# Patient Record
Sex: Female | Born: 1960 | Race: White | Hispanic: No | Marital: Married | Smoking: Never smoker
Health system: Southern US, Community
[De-identification: ages and names within clinical notes are randomized; demographics above are authoritative.]

## PROBLEM LIST (undated history)

## (undated) DIAGNOSIS — I10 Essential (primary) hypertension: Secondary | ICD-10-CM

## (undated) DIAGNOSIS — K589 Irritable bowel syndrome without diarrhea: Secondary | ICD-10-CM

## (undated) DIAGNOSIS — S42309A Unspecified fracture of shaft of humerus, unspecified arm, initial encounter for closed fracture: Secondary | ICD-10-CM

## (undated) DIAGNOSIS — M1990 Unspecified osteoarthritis, unspecified site: Secondary | ICD-10-CM

## (undated) DIAGNOSIS — M8580 Other specified disorders of bone density and structure, unspecified site: Secondary | ICD-10-CM

## (undated) HISTORY — PX: PELVIC LAPAROSCOPY: SHX162

## (undated) HISTORY — PX: HYSTEROSCOPY: SHX211

## (undated) HISTORY — DX: Unspecified osteoarthritis, unspecified site: M19.90

## (undated) HISTORY — DX: Unspecified fracture of shaft of humerus, unspecified arm, initial encounter for closed fracture: S42.309A

## (undated) HISTORY — DX: Essential (primary) hypertension: I10

## (undated) HISTORY — DX: Irritable bowel syndrome, unspecified: K58.9

## (undated) HISTORY — PX: DILATION AND CURETTAGE OF UTERUS: SHX78

## (undated) HISTORY — DX: Other specified disorders of bone density and structure, unspecified site: M85.80

## (undated) HISTORY — PX: TOTAL KNEE ARTHROPLASTY: SHX125

---

## 1997-08-09 ENCOUNTER — Other Ambulatory Visit: Admission: RE | Admit: 1997-08-09 | Discharge: 1997-08-09 | Payer: Self-pay | Admitting: Obstetrics and Gynecology

## 1998-09-22 ENCOUNTER — Other Ambulatory Visit: Admission: RE | Admit: 1998-09-22 | Discharge: 1998-09-22 | Payer: Self-pay | Admitting: Obstetrics and Gynecology

## 1999-09-22 ENCOUNTER — Other Ambulatory Visit: Admission: RE | Admit: 1999-09-22 | Discharge: 1999-09-22 | Payer: Self-pay | Admitting: Obstetrics and Gynecology

## 2000-08-20 ENCOUNTER — Ambulatory Visit (HOSPITAL_COMMUNITY): Admission: RE | Admit: 2000-08-20 | Discharge: 2000-08-20 | Payer: Self-pay | Admitting: Obstetrics and Gynecology

## 2000-08-20 ENCOUNTER — Encounter: Payer: Self-pay | Admitting: Obstetrics and Gynecology

## 2000-08-29 ENCOUNTER — Encounter: Admission: RE | Admit: 2000-08-29 | Discharge: 2000-08-29 | Payer: Self-pay | Admitting: Obstetrics and Gynecology

## 2000-08-29 ENCOUNTER — Encounter: Payer: Self-pay | Admitting: Obstetrics and Gynecology

## 2000-09-24 ENCOUNTER — Other Ambulatory Visit: Admission: RE | Admit: 2000-09-24 | Discharge: 2000-09-24 | Payer: Self-pay | Admitting: Obstetrics and Gynecology

## 2001-09-24 ENCOUNTER — Other Ambulatory Visit: Admission: RE | Admit: 2001-09-24 | Discharge: 2001-09-24 | Payer: Self-pay | Admitting: Obstetrics and Gynecology

## 2001-10-01 ENCOUNTER — Encounter: Payer: Self-pay | Admitting: Obstetrics and Gynecology

## 2001-10-01 ENCOUNTER — Encounter: Admission: RE | Admit: 2001-10-01 | Discharge: 2001-10-01 | Payer: Self-pay | Admitting: Obstetrics and Gynecology

## 2002-09-25 ENCOUNTER — Other Ambulatory Visit: Admission: RE | Admit: 2002-09-25 | Discharge: 2002-09-25 | Payer: Self-pay | Admitting: Obstetrics and Gynecology

## 2002-10-28 ENCOUNTER — Other Ambulatory Visit: Admission: RE | Admit: 2002-10-28 | Discharge: 2002-10-28 | Payer: Self-pay | Admitting: Obstetrics and Gynecology

## 2003-04-09 ENCOUNTER — Other Ambulatory Visit: Admission: RE | Admit: 2003-04-09 | Discharge: 2003-04-09 | Payer: Self-pay | Admitting: Obstetrics and Gynecology

## 2003-04-13 ENCOUNTER — Encounter: Admission: RE | Admit: 2003-04-13 | Discharge: 2003-04-13 | Payer: Self-pay | Admitting: Obstetrics and Gynecology

## 2003-05-13 ENCOUNTER — Encounter (INDEPENDENT_AMBULATORY_CARE_PROVIDER_SITE_OTHER): Payer: Self-pay | Admitting: *Deleted

## 2003-05-13 ENCOUNTER — Ambulatory Visit (HOSPITAL_COMMUNITY): Admission: RE | Admit: 2003-05-13 | Discharge: 2003-05-13 | Payer: Self-pay | Admitting: Obstetrics and Gynecology

## 2003-05-13 ENCOUNTER — Ambulatory Visit (HOSPITAL_BASED_OUTPATIENT_CLINIC_OR_DEPARTMENT_OTHER): Admission: RE | Admit: 2003-05-13 | Discharge: 2003-05-13 | Payer: Self-pay | Admitting: Obstetrics and Gynecology

## 2003-10-05 ENCOUNTER — Other Ambulatory Visit: Admission: RE | Admit: 2003-10-05 | Discharge: 2003-10-05 | Payer: Self-pay | Admitting: Obstetrics and Gynecology

## 2004-01-09 HISTORY — PX: VAGINAL HYSTERECTOMY: SUR661

## 2004-01-17 ENCOUNTER — Encounter (INDEPENDENT_AMBULATORY_CARE_PROVIDER_SITE_OTHER): Payer: Self-pay | Admitting: *Deleted

## 2004-01-17 ENCOUNTER — Observation Stay (HOSPITAL_COMMUNITY): Admission: RE | Admit: 2004-01-17 | Discharge: 2004-01-18 | Payer: Self-pay | Admitting: Obstetrics and Gynecology

## 2004-05-10 ENCOUNTER — Encounter: Admission: RE | Admit: 2004-05-10 | Discharge: 2004-05-10 | Payer: Self-pay | Admitting: Obstetrics and Gynecology

## 2004-10-05 ENCOUNTER — Other Ambulatory Visit: Admission: RE | Admit: 2004-10-05 | Discharge: 2004-10-05 | Payer: Self-pay | Admitting: Obstetrics and Gynecology

## 2005-05-14 ENCOUNTER — Encounter: Admission: RE | Admit: 2005-05-14 | Discharge: 2005-05-14 | Payer: Self-pay | Admitting: Obstetrics and Gynecology

## 2005-10-09 ENCOUNTER — Other Ambulatory Visit: Admission: RE | Admit: 2005-10-09 | Discharge: 2005-10-09 | Payer: Self-pay | Admitting: Obstetrics and Gynecology

## 2006-05-21 ENCOUNTER — Encounter: Admission: RE | Admit: 2006-05-21 | Discharge: 2006-05-21 | Payer: Self-pay | Admitting: Obstetrics and Gynecology

## 2006-10-15 ENCOUNTER — Other Ambulatory Visit: Admission: RE | Admit: 2006-10-15 | Discharge: 2006-10-15 | Payer: Self-pay | Admitting: Obstetrics and Gynecology

## 2007-05-27 ENCOUNTER — Encounter: Admission: RE | Admit: 2007-05-27 | Discharge: 2007-05-27 | Payer: Self-pay | Admitting: Obstetrics and Gynecology

## 2007-10-20 ENCOUNTER — Other Ambulatory Visit: Admission: RE | Admit: 2007-10-20 | Discharge: 2007-10-20 | Payer: Self-pay | Admitting: Obstetrics and Gynecology

## 2007-10-20 ENCOUNTER — Encounter: Payer: Self-pay | Admitting: Obstetrics and Gynecology

## 2007-10-20 ENCOUNTER — Ambulatory Visit: Payer: Self-pay | Admitting: Obstetrics and Gynecology

## 2008-06-22 ENCOUNTER — Encounter: Admission: RE | Admit: 2008-06-22 | Discharge: 2008-06-22 | Payer: Self-pay | Admitting: Obstetrics and Gynecology

## 2008-10-25 ENCOUNTER — Other Ambulatory Visit: Admission: RE | Admit: 2008-10-25 | Discharge: 2008-10-25 | Payer: Self-pay | Admitting: Obstetrics and Gynecology

## 2008-10-25 ENCOUNTER — Encounter: Payer: Self-pay | Admitting: Obstetrics and Gynecology

## 2008-10-25 ENCOUNTER — Ambulatory Visit: Payer: Self-pay | Admitting: Obstetrics and Gynecology

## 2009-06-27 ENCOUNTER — Encounter: Admission: RE | Admit: 2009-06-27 | Discharge: 2009-06-27 | Payer: Self-pay | Admitting: Obstetrics and Gynecology

## 2009-10-31 ENCOUNTER — Ambulatory Visit: Payer: Self-pay | Admitting: Obstetrics and Gynecology

## 2009-10-31 ENCOUNTER — Other Ambulatory Visit: Admission: RE | Admit: 2009-10-31 | Discharge: 2009-10-31 | Payer: Self-pay | Admitting: Obstetrics and Gynecology

## 2010-05-26 NOTE — H&P (Signed)
NAMESheronica, Corey Pittman              ACCOUNT NO.:  [id number]   MEDICAL RECORD NO.:  [id number]          PATIENT TYPE:  AMB   LOCATION:  DAY                          FACILITY:  [facility]   PHYSICIAN:  Daniel L. Gottsegen, M.D.DATE OF BIRTH:  Nov 24, 1960   DATE OF ADMISSION:  01/17/2004  DATE OF DISCHARGE:                                HISTORY & PHYSICAL   CHIEF COMPLAINT:  Menorrhagia, dysmenorrhea, with anemia and fibroids.   HISTORY OF PRESENT ILLNESS:  The patient is a 50 year old nulligravida who  has been followed in the office over the past several years with increasing  menorrhagia and dysmenorrhea.  She has become anemic as a result of the  above and has required iron therapy.  She finds that when she has her  periods they are excessively heavy.  If she is in the car and taking a 2-  hour trip she ends up having to stop five or six times because her periods  are so heavy.  She had previously had a D&C hysteroscopy for a small  endometrial polyp but her problem has persisted in spite of removing that.  It is felt that at least part of the problem is due to her multiple small  fibroids in her uterus.  She has also tried birth control pills with only  moderate success and she is now having side effects from her birth control  pills.  In addition, she has tried a variety of nonsteroidal  antiinflammatory drugs.  As a result of this, she enters the hospital now  for a vaginal hysterectomy for treatment of menometrorrhagia nonresponsive  to the above.  She knows that there is a chance that it may have to be done  abdominally since she has never had children.   PAST MEDICAL HISTORY:  Reveals a previous D&C hysteroscopy for DUB.  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   PRESENT MEDICATIONS:  Iron.  She is allergic to  CODEINE.  Other medications  besides iron are Allegra D, Detrol LA, Flonase, and Imitrex for headaches.   FAMILY HISTORY:  Mother had lung cancer.  Mother also was hypertensive as  was her maternal grandmother and her father.   SOCIAL HISTORY:  She is a nonsmoker, nondrinker.   REVIEW OF SYSTEMS:  HEENT:  Reveals frequent migraines.  CARDIAC:  Negative.  PULMONARY:  Reveals a history of asthma.  GI:  Negative.  GU:  Reveals a  history of overactive bladder.  NEUROMUSCULAR:  Negative.  ENDOCRINE:  Negative.   PHYSICAL EXAMINATION:  GENERAL:  The patient is a well-developed and well-  nourished female in no acute distress.  VITAL SIGNS:  Blood pressure is 130/80, pulse is 80 and regular,  respirations 16 and nonlabored, she is afebrile.  HEENT:  All within normal limits.  NECK:  Supple, trachea in the midline.  Thyroid is not enlarged.  LUNGS:  Clear to P&A.  HEART:  No thrills, heaves, or murmurs.  BREASTS:  No masses.  ABDOMEN:  Soft without guarding, rebound, or masses.  PELVIC:  External and vaginal is normal.  Cervix is clean.  Pap smear shows  no atypia.  Uterus is enlarged by small fibroids to 6-7 weeks size.  Adnexa  are palpably normal.  Rectal is negative.   ADMISSION IMPRESSION:  Increasing dysmenorrhea, menorrhagia, fibroids, and  anemia.   PLAN:  Vaginal hysterectomy.     Dani   DLG/MEDQ  D:  01/17/2004  T:  01/17/2004  Job:  [id number]

## 2010-05-26 NOTE — Op Note (Signed)
Pamela Pittman, Pamela Pittman                        ACCOUNT NO.:  [id number]   MEDICAL RECORD NO.:  [id number]                   PATIENT TYPE:  AMB   LOCATION:  [facility]                                 FACILITY:  [facility]   PHYSICIAN:  Daniel L. Eda Paschal, M.D.           DATE OF BIRTH:  1960-02-07   DATE OF PROCEDURE:  05/13/2003  DATE OF DISCHARGE:                                 OPERATIVE REPORT   PREOPERATIVE DIAGNOSIS:  Dysfunctional uterine bleeding with multiple  endometrial polyps.   POSTOPERATIVE DIAGNOSIS:  Dysfunctional uterine bleeding with multiple  endometrial polyps.   OPERATION/PROCEDURE:  Hysteroscopy, dilatation and curettage.   SURGEON:  Daniel L. Eda Paschal, M.D.   ANESTHESIA:  General.   INDICATIONS:  The patient is a 50 year old nulligravida, who presented to  the office with menometrorrhagia nonresponsive to oral progestins.  An SIH  was done in the office which showed multiple filling defects, intrauterine  cavity.  She now enters the hospital for excision of endometrial polyps.   FINDINGS:  External and vaginal normal.  Cervix is clean. Uterus is severely  retroverted, normal size and shape with less than first-degree uterine  descensus.  Adnexa palpably normal.  At the time of hysteroscopy, the  patient had multiple small endometrial polyps involving three of the four  walls of the fundus.  Other than this, there were no other abnormal  findings.   DESCRIPTION OF PROCEDURE:  After adequate general anesthesia, the patient  was placed in the dorsal lithotomy position and prepped and draped in the  usual sterile manner.  Single-tooth tenaculum was placed in the anterior lip  of the cervix.  Dilatation was difficult both because of her retroverted  uterus and nulligravida state.  She could be dilated to about a 29 but at  this point it became exceedingly difficult.  It was elected, therefore, to  first start with a diagnostic hysteroscope.  It was placed and 3%  sorbitol  was used to expand the intrauterine cavity.  Camera was used for  magnification.  A good view was obtained and the patient had multiple  defects that appeared to be endometrial polyps.  At this point hysteroscope  was removed.  She was curetted with three different size curets.  Multiple  polyps were removed as well as endometrial curettings and all were sent as  one specimen to pathology.  She was then rehysteroscoped and it appeared  that there were no more defects and that the cavity was now clean.  One more  attempt was made to dilate her further with hopes that if we put the  resectoscope in, we might see something else because it was somewhat of a  dark view with the diagnostic scope, but it was really not possible to  dilate her anymore without injuring the cervix.  Once again, we tried to  follow the scope in under the pressure with the resectoscope, and this was  not possible  so the procedure was terminated.  Blood loss for the entire  procedure was less than 30 mL with none replaced.  Fluid deficit was 125 mL.  The patient tolerated the procedure well and left the operating room in  satisfactory condition.                                               Daniel L. Eda Paschal, M.D.   Tonette Bihari  D:  05/13/2003  T:  05/13/2003  Job:  [id number]

## 2010-05-26 NOTE — Op Note (Signed)
NAMEAnthonette, Pamela Pittman              ACCOUNT NO.:  [id number]   MEDICAL RECORD NO.:  [id number]          PATIENT TYPE:  AMB   LOCATION:  DAY                          FACILITY:  [facility]   PHYSICIAN:  Daniel L. Gottsegen, M.D.DATE OF BIRTH:  06/22/1960   DATE OF PROCEDURE:  01/17/2004  DATE OF DISCHARGE:                                 OPERATIVE REPORT   PREOPERATIVE DIAGNOSES:  1.  Dysfunctional uterine bleeding with fibroids.  2.  Anemia.  3.  Dysmenorrhea.   POSTOPERATIVE DIAGNOSES:  1.  Dysfunctional uterine bleeding with fibroids.  2.  Anemia.  3.  Dysmenorrhea.   OPERATION:  Vaginal hysterectomy.   SURGEON:  Daniel L. Eda Paschal, M.D.   FIRST ASSISTANT:  Timothy P. Fontaine, M.D.   ANESTHESIA:  General endotracheal.   FINDINGS:  The patient's uterus was enlarged by multiple small fibroids to  about 6-7 weeks' size.  Ovaries, fallopian tubes, and pelvic peritoneum were  free of any disease.   PROCEDURE:  After adequate general endotracheal anesthesia, the patient was  placed in the dorsal lithotomy position and prepped and draped in the usual  sterile manner.  A 1:200,000 solution of epinephrine and 0.5% Xylocaine was  injected around the cervix.  A 360-degree incision was made around the  cervix.  The bladder was mobilized superiorly as was the posterior  peritoneum.  The posterior peritoneum was entered by sharp dissection.  At  this point, we could not enter the vesicouterine fold of peritoneum so the  uterosacral and cardinal ligaments were clamped, cut, and suture ligated,  uterosacrals were shortened and sutured to the vault laterally for good  vault support.  The bladder was advanced sharply and the vesicouterine fold  of the peritoneum could be entered with sharp dissection.  Uterine arteries,  balance of the broad ligament, and uteroovarian ligaments, round ligaments,  and fallopian tubes were all clamped, cut, and suture ligated.  The uterus  was delivered intact  and sent to pathology for tissue diagnosis.  Suture  material for all the above-mentioned pedicles was #1 chronic catgut.  Major  vascular bundles were doubly ligated.  The vaginal cuff was whip stitched to  the posterior peritoneum with a running locking 0 Vicryl.  Copious  irrigation was then done with sterile saline.  Two sponge, needle, and  instrument counts were  correct.  The peritoneum and the cuff were closed with figure-of-eights of  #1 chromic catgut.  A Foley catheter was inserted which drained clear urine.  Estimated blood loss for the entire procedure was 50 cc with none replaced.  The patient tolerated the procedure well and left the operating room in  satisfactory condition.     Dani   DLG/MEDQ  D:  01/17/2004  T:  01/17/2004  Job:  [id number]

## 2010-05-31 ENCOUNTER — Other Ambulatory Visit: Payer: Self-pay | Admitting: Obstetrics and Gynecology

## 2010-05-31 DIAGNOSIS — Z1231 Encounter for screening mammogram for malignant neoplasm of breast: Secondary | ICD-10-CM

## 2010-07-07 ENCOUNTER — Ambulatory Visit
Admission: RE | Admit: 2010-07-07 | Discharge: 2010-07-07 | Disposition: A | Discharge status: Home / Self Care | Payer: BC Managed Care – PPO | Source: Ambulatory Visit | Attending: Obstetrics and Gynecology | Admitting: Obstetrics and Gynecology

## 2010-07-07 DIAGNOSIS — Z1231 Encounter for screening mammogram for malignant neoplasm of breast: Secondary | ICD-10-CM

## 2010-11-08 ENCOUNTER — Encounter: Payer: Self-pay | Admitting: Gynecology

## 2010-11-08 DIAGNOSIS — B589 Toxoplasmosis, unspecified: Secondary | ICD-10-CM | POA: Insufficient documentation

## 2010-11-08 DIAGNOSIS — M1990 Unspecified osteoarthritis, unspecified site: Secondary | ICD-10-CM | POA: Insufficient documentation

## 2010-11-08 DIAGNOSIS — J45909 Unspecified asthma, uncomplicated: Secondary | ICD-10-CM | POA: Insufficient documentation

## 2010-11-17 ENCOUNTER — Other Ambulatory Visit (HOSPITAL_COMMUNITY)
Admission: RE | Admit: 2010-11-17 | Discharge: 2010-11-17 | Disposition: A | Discharge status: Home / Self Care | Payer: BC Managed Care – PPO | Source: Ambulatory Visit | Attending: Obstetrics and Gynecology | Admitting: Obstetrics and Gynecology

## 2010-11-17 ENCOUNTER — Ambulatory Visit (INDEPENDENT_AMBULATORY_CARE_PROVIDER_SITE_OTHER): Payer: BC Managed Care – PPO | Admitting: Obstetrics and Gynecology

## 2010-11-17 ENCOUNTER — Encounter: Payer: Self-pay | Admitting: Obstetrics and Gynecology

## 2010-11-17 VITALS — BP 112/70 | Ht 64.0 in | Wt 156.0 lb

## 2010-11-17 DIAGNOSIS — Z01419 Encounter for gynecological examination (general) (routine) without abnormal findings: Secondary | ICD-10-CM

## 2010-11-17 DIAGNOSIS — N951 Menopausal and female climacteric states: Secondary | ICD-10-CM

## 2010-11-17 DIAGNOSIS — Z780 Asymptomatic menopausal state: Secondary | ICD-10-CM

## 2010-11-17 MED ORDER — ESTRADIOL 1 MG PO TABS: ORAL_TABLET | ORAL | Status: DC

## 2010-11-17 MED ORDER — ALPRAZOLAM 0.25 MG PO TABS: 0.2500 mg | ORAL_TABLET | Freq: Every evening | ORAL | Status: AC | PRN

## 2010-11-17 NOTE — Progress Notes (Signed)
Patient came to see me today for her annual GYN exam. In January of 2012 she had knee replacement surgery. Since then she's been having a lot more hot flashes on her 1 mg of estradiol daily. She is up-to-date on mammograms. She had a bone density in [facility] where she lives. She was told was normal. She does her lab through PCP. She's having no vaginal bleeding and no pelvic pain.  HEENT: Within normal limits. [name] present Neck: No masses. Supraclavicular lymph nodes: Not enlarged. Breasts: Examined in both sitting and lying position. Symmetrical without skin changes or masses. Abdomen: Soft no masses guarding or rebound. No hernias. Pelvic: External within normal limits. BUS within normal limits. Vaginal examination shows good estrogen effect, no cystocele enterocele or rectocele. Cervix and uterus absent. Adnexa within normal limits. Rectovaginal confirmatory. Extremities within normal limits.  Assessment: Increasing vasomotor symptoms  Plan: Estradiol increase to 1 and 1/2 mg daily. Discussed patch estrogen. Patient declined.  continue yearly mammograms.

## 2011-06-21 ENCOUNTER — Other Ambulatory Visit: Payer: Self-pay | Admitting: Obstetrics and Gynecology

## 2011-06-21 DIAGNOSIS — Z1231 Encounter for screening mammogram for malignant neoplasm of breast: Secondary | ICD-10-CM

## 2011-07-13 ENCOUNTER — Ambulatory Visit: Payer: BC Managed Care – PPO

## 2011-07-18 ENCOUNTER — Ambulatory Visit
Admission: RE | Admit: 2011-07-18 | Discharge: 2011-07-18 | Disposition: A | Discharge status: Home / Self Care | Payer: BC Managed Care – PPO | Source: Ambulatory Visit | Attending: Obstetrics and Gynecology | Admitting: Obstetrics and Gynecology

## 2011-07-18 DIAGNOSIS — Z1231 Encounter for screening mammogram for malignant neoplasm of breast: Secondary | ICD-10-CM

## 2011-07-19 ENCOUNTER — Ambulatory Visit (INDEPENDENT_AMBULATORY_CARE_PROVIDER_SITE_OTHER): Payer: BC Managed Care – PPO | Admitting: Obstetrics and Gynecology

## 2011-07-19 DIAGNOSIS — N899 Noninflammatory disorder of vagina, unspecified: Secondary | ICD-10-CM

## 2011-07-19 DIAGNOSIS — N898 Other specified noninflammatory disorders of vagina: Secondary | ICD-10-CM

## 2011-07-19 DIAGNOSIS — R300 Dysuria: Secondary | ICD-10-CM

## 2011-07-19 LAB — WET PREP FOR TRICH, YEAST, CLUE
Trich, Wet Prep: NONE SEEN
Yeast Wet Prep HPF POC: NONE SEEN

## 2011-07-19 LAB — URINALYSIS W MICROSCOPIC + REFLEX CULTURE
Casts: NONE SEEN
Crystals: NONE SEEN
Ketones, ur: NEGATIVE mg/dL
Nitrite: NEGATIVE
Specific Gravity, Urine: <1.005 — ABNORMAL LOW (ref 1.005–1.030)
Urobilinogen, UA: 0.2 mg/dL (ref 0.0–1.0)
pH: 5 (ref 5.0–8.0)

## 2011-07-19 MED ORDER — NYSTATIN-TRIAMCINOLONE 100000-0.1 UNIT/GM-% EX OINT: TOPICAL_OINTMENT | Freq: Two times a day (BID) | CUTANEOUS | Status: AC

## 2011-07-19 MED ORDER — FLUCONAZOLE 150 MG PO TABS: 150.0000 mg | ORAL_TABLET | Freq: Once | ORAL | Status: AC

## 2011-07-19 NOTE — Progress Notes (Signed)
Patient came to see me today with a two-week history of vulvar burning which also occurs when she urinates. In early June she was on antibiotic for a upper respiratory infection. Several weeks ago she used a feminine deodorant spray which was old and it almost exploded when it sprayed and she immediately got burning on the vulvar. She is used Monistat cream with slight relief.  Exam: [name] present. External: Slight irritation without any lesions. Vaginal exam: White discharge with negative wet prep. Urinalysis: 3-6 white blood cells.  Assessment: Either contact vulvitis from spray or yeast vaginitis or both.  Plan: Nystatin/triamcinolone cream apply twice a day. Diflucan 150 mg daily for 3 days. Urine culture done.

## 2011-07-21 LAB — URINE CULTURE: Colony Count: NO GROWTH

## 2011-08-07 ENCOUNTER — Telehealth: Payer: Self-pay | Admitting: *Deleted

## 2011-08-07 NOTE — Telephone Encounter (Signed)
Called patient to let her know that we received a letter from [facility] that stated they have been unable to reach her for scheduling a follow up colonoscopy.  Spoke to the patient and she said that she will be doing the follow up in the Fall when she returns from [facility]

## 2011-11-26 ENCOUNTER — Ambulatory Visit (INDEPENDENT_AMBULATORY_CARE_PROVIDER_SITE_OTHER): Payer: BC Managed Care – PPO | Admitting: Obstetrics and Gynecology

## 2011-11-26 ENCOUNTER — Encounter: Payer: Self-pay | Admitting: Obstetrics and Gynecology

## 2011-11-26 VITALS — BP 124/78 | Ht 64.0 in | Wt 172.0 lb

## 2011-11-26 DIAGNOSIS — K589 Irritable bowel syndrome without diarrhea: Secondary | ICD-10-CM | POA: Insufficient documentation

## 2011-11-26 DIAGNOSIS — Z01419 Encounter for gynecological examination (general) (routine) without abnormal findings: Secondary | ICD-10-CM

## 2011-11-26 DIAGNOSIS — R102 Pelvic and perineal pain: Secondary | ICD-10-CM

## 2011-11-26 DIAGNOSIS — N949 Unspecified condition associated with female genital organs and menstrual cycle: Secondary | ICD-10-CM

## 2011-11-26 DIAGNOSIS — Z23 Encounter for immunization: Secondary | ICD-10-CM

## 2011-11-26 LAB — URINALYSIS W MICROSCOPIC + REFLEX CULTURE
Glucose, UA: NEGATIVE mg/dL
Leukocytes, UA: NEGATIVE
Protein, ur: NEGATIVE mg/dL
Urobilinogen, UA: 0.2 mg/dL (ref 0.0–1.0)

## 2011-11-26 MED ORDER — ESTRADIOL 1 MG PO TABS: ORAL_TABLET | ORAL | Status: DC

## 2011-11-26 NOTE — Patient Instructions (Signed)
Schedule ultrasound

## 2011-11-26 NOTE — Progress Notes (Signed)
Patient came to see me today for her annual GYN exam. She remains on 1 mg of estradiol but feels she is having enough menopausal symptoms do increase it. She had a vaginal hysterectomy in 2006 for dysfunctional uterine bleeding and fibroids. She has never had an abnormal Pap smear. Her last Pap smear was 2012. For several months she has had suprapubic discomfort. It occurs at least weekly if not more often. It does not appear to be associated with GI or GU symptoms. She will occasionally have dyspareunia but we wonder if some of that is atrophic vaginitis. She does her bone densities in [facility]. They are normal. She is up-to-date on mammograms. She is having no vaginal bleeding. She does her lab in [facility].  HEENT: Within normal limits.Pamela Pittman present. Neck: No masses. Supraclavicular lymph nodes: Not enlarged. Breasts: Examined in both sitting and lying position. Symmetrical without skin changes or masses. Abdomen: Soft no masses guarding or rebound. No hernias. Pelvic: External within normal limits. BUS within normal limits. Vaginal examination shows good estrogen effect, no cystocele enterocele or rectocele. Cervix and uterus absent. Adnexa within normal limits. Rectovaginal confirmatory. Extremities within normal limits.  Assessment: #1. Suprapubic discomfort #2. Occasional dyspareunia #3. Vasomotor symptoms  Plan: Urine culture. Pelvic ultrasound scheduled. Increase estradiol  To 1.5 mg daily. No Pap done.The new Pap smear guidelines were discussed with the patient.

## 2011-11-28 ENCOUNTER — Ambulatory Visit (INDEPENDENT_AMBULATORY_CARE_PROVIDER_SITE_OTHER): Payer: BC Managed Care – PPO

## 2011-11-28 ENCOUNTER — Ambulatory Visit (INDEPENDENT_AMBULATORY_CARE_PROVIDER_SITE_OTHER): Payer: BC Managed Care – PPO | Admitting: Obstetrics and Gynecology

## 2011-11-28 DIAGNOSIS — N949 Unspecified condition associated with female genital organs and menstrual cycle: Secondary | ICD-10-CM

## 2011-11-28 DIAGNOSIS — N83339 Acquired atrophy of ovary and fallopian tube, unspecified side: Secondary | ICD-10-CM

## 2011-11-28 DIAGNOSIS — R102 Pelvic and perineal pain: Secondary | ICD-10-CM

## 2011-11-28 LAB — URINE CULTURE: Colony Count: 9000

## 2011-11-28 NOTE — Progress Notes (Signed)
Patient came to see me for an ultrasound because of a one-two month history of suprapubic pressure. Urine culture was negative. On ultrasound her uterus is surgically absent. Her right ovary has microcalcifications but no masses. Her left ovary could not be imaged but there is no apparent mass on either adnexa. There is increased bowel activity suggestive of irritable bowel syndrome which patient has a history of. Her cul-de-sac is free of fluid.  Assessment: Normal GYN ultrasound  Plan: Patient reassured. Discussed irritable bowel syndrome. She will make an appointment to see her GI doctor in [place].

## 2011-11-28 NOTE — Patient Instructions (Signed)
Please make an appointment to see your  GI doctor in [place].

## 2012-06-05 ENCOUNTER — Other Ambulatory Visit: Payer: Self-pay

## 2012-06-05 DIAGNOSIS — Z1231 Encounter for screening mammogram for malignant neoplasm of breast: Secondary | ICD-10-CM

## 2012-07-29 ENCOUNTER — Ambulatory Visit
Admission: RE | Admit: 2012-07-29 | Discharge: 2012-07-29 | Disposition: A | Discharge status: Home / Self Care | Payer: BC Managed Care – PPO | Source: Ambulatory Visit

## 2012-07-29 DIAGNOSIS — Z1231 Encounter for screening mammogram for malignant neoplasm of breast: Secondary | ICD-10-CM

## 2012-11-26 ENCOUNTER — Ambulatory Visit (INDEPENDENT_AMBULATORY_CARE_PROVIDER_SITE_OTHER): Payer: BC Managed Care – PPO | Admitting: Gynecology

## 2012-11-26 ENCOUNTER — Encounter: Payer: Self-pay | Admitting: Gynecology

## 2012-11-26 VITALS — BP 120/76 | Ht 64.0 in | Wt 169.0 lb

## 2012-11-26 DIAGNOSIS — Z01419 Encounter for gynecological examination (general) (routine) without abnormal findings: Secondary | ICD-10-CM

## 2012-11-26 DIAGNOSIS — Z79890 Hormone replacement therapy (postmenopausal): Secondary | ICD-10-CM

## 2012-11-26 MED ORDER — ESTRADIOL 1 MG PO TABS: ORAL_TABLET | ORAL | Status: DC

## 2012-11-26 NOTE — Progress Notes (Signed)
This is well below one year if Pamela Pittman May 17, 1960 [id number]        52 y.o.  G0P0 for annual exam.  Former patient of Dr. Eda Paschal. Several issues noted below.  Past medical history,surgical history, problem list, medications, allergies, family history and social history were all reviewed and documented in the EPIC chart.  ROS:  Performed and pertinent positives and negatives are included in the history, assessment and plan .  Exam: Kim assistant Filed Vitals:   11/26/12 1031  BP: 120/76  Height: 5\' 4"  (1.626 m)  Weight: 169 lb (76.658 kg)   General appearance  Normal Skin grossly normal Head/Neck normal with no cervical or supraclavicular adenopathy thyroid normal Lungs  clear Cardiac RR, without RMG Abdominal  soft, nontender, without masses, organomegaly or hernia Breasts  examined lying and sitting without masses, retractions, discharge or axillary adenopathy. Pelvic  Ext/BUS/vagina  normal   Adnexa  Without masses or tenderness    Anus and perineum  normal   Rectovaginal  normal sphincter tone without palpated masses or tenderness.    Assessment/Plan:  52 y.o. G0P0 female for annual exam.   1. Status post TVH 2006 for leiomyomata/DUB. Started on ERT due to symptoms and currently on estradiol 1.5 mg daily. Has tried weaning with unacceptable hot flushes and night sweats.  I reviewed the whole issue of HRT with her to include the WHI study with increased risk of stroke, heart attack, DVT and breast cancer. The ACOG and NAMS statements for lowest dose for the shortest period of time reviewed. Transdermal versus oral first-pass effect benefit discussed.  Patient wants to continue and I refilled her x1 year. 2. Pap smear 2012. No Pap smear done today. No history of abnormal Pap smears previously. Review current screening guidelines and options to stop screening as she is status post hysterectomy versus less frequent screening intervals reviewed. Will readdress on annual  basis. 3. Mammography 07/2012. Continue with annual mammography. SBE monthly reviewed 4. Colonoscopy 2014. Repeat at their recommended interval. 5. Health maintenance. No blood work done and she reports this is all done in [place] at her other physician's office. Followup one year, sooner as needed.   Note: This document was prepared with digital dictation and possible smart phrase technology. Any transcriptional errors that result from this process are unintentional.   Dara Lords MD, 11:07 AM 11/26/2012

## 2012-11-26 NOTE — Patient Instructions (Signed)
Follow up in one year, sooner as needed. 

## 2012-11-27 LAB — URINALYSIS W MICROSCOPIC + REFLEX CULTURE
Bacteria, UA: NONE SEEN
Hgb urine dipstick: NEGATIVE
Ketones, ur: NEGATIVE mg/dL
Leukocytes, UA: NEGATIVE
Nitrite: NEGATIVE
Specific Gravity, Urine: 1.021 (ref 1.005–1.030)
Urobilinogen, UA: 0.2 mg/dL (ref 0.0–1.0)

## 2013-06-05 ENCOUNTER — Other Ambulatory Visit: Payer: Self-pay

## 2013-06-05 DIAGNOSIS — Z1231 Encounter for screening mammogram for malignant neoplasm of breast: Secondary | ICD-10-CM

## 2013-08-04 ENCOUNTER — Ambulatory Visit
Admission: RE | Admit: 2013-08-04 | Discharge: 2013-08-04 | Disposition: A | Discharge status: Home / Self Care | Payer: BC Managed Care – PPO | Source: Ambulatory Visit

## 2013-08-04 DIAGNOSIS — Z1231 Encounter for screening mammogram for malignant neoplasm of breast: Secondary | ICD-10-CM

## 2013-12-02 ENCOUNTER — Encounter: Payer: Self-pay | Admitting: Gynecology

## 2013-12-02 ENCOUNTER — Other Ambulatory Visit (HOSPITAL_COMMUNITY)
Admission: RE | Admit: 2013-12-02 | Discharge: 2013-12-02 | Disposition: A | Discharge status: Home / Self Care | Payer: BC Managed Care – PPO | Source: Ambulatory Visit | Attending: Gynecology | Admitting: Gynecology

## 2013-12-02 ENCOUNTER — Ambulatory Visit (INDEPENDENT_AMBULATORY_CARE_PROVIDER_SITE_OTHER): Payer: BC Managed Care – PPO | Admitting: Gynecology

## 2013-12-02 VITALS — BP 134/80 | Ht 64.0 in | Wt 178.0 lb

## 2013-12-02 DIAGNOSIS — Z01419 Encounter for gynecological examination (general) (routine) without abnormal findings: Secondary | ICD-10-CM | POA: Diagnosis present

## 2013-12-02 DIAGNOSIS — Z79890 Hormone replacement therapy (postmenopausal): Secondary | ICD-10-CM

## 2013-12-02 DIAGNOSIS — Z23 Encounter for immunization: Secondary | ICD-10-CM

## 2013-12-02 DIAGNOSIS — N951 Menopausal and female climacteric states: Secondary | ICD-10-CM

## 2013-12-02 LAB — CBC WITH DIFFERENTIAL/PLATELET
BASOS ABS: 0 10*3/uL (ref 0.0–0.1)
Basophils Relative: 1 % (ref 0–1)
Eosinophils Absolute: 0.2 10*3/uL (ref 0.0–0.7)
Eosinophils Relative: 4 % (ref 0–5)
HCT: 39.7 % (ref 36.0–46.0)
HEMOGLOBIN: 13 g/dL (ref 12.0–15.0)
LYMPHS PCT: 17 % (ref 12–46)
Lymphs Abs: 0.8 10*3/uL (ref 0.7–4.0)
MCH: 30.1 pg (ref 26.0–34.0)
MCHC: 32.7 g/dL (ref 30.0–36.0)
MCV: 91.9 fL (ref 78.0–100.0)
MONO ABS: 0.5 10*3/uL (ref 0.1–1.0)
MPV: 10 fL (ref 9.4–12.4)
Monocytes Relative: 10 % (ref 3–12)
NEUTROS ABS: 3.1 10*3/uL (ref 1.7–7.7)
NEUTROS PCT: 68 % (ref 43–77)
Platelets: 259 10*3/uL (ref 150–400)
RBC: 4.32 MIL/uL (ref 3.87–5.11)
RDW: 14 % (ref 11.5–15.5)
WBC: 4.6 10*3/uL (ref 4.0–10.5)

## 2013-12-02 LAB — COMPREHENSIVE METABOLIC PANEL
ALBUMIN: 4 g/dL (ref 3.5–5.2)
ALK PHOS: 74 U/L (ref 39–117)
ALT: 17 U/L (ref 0–35)
AST: 18 U/L (ref 0–37)
BILIRUBIN TOTAL: 0.2 mg/dL (ref 0.2–1.2)
BUN: 25 mg/dL — ABNORMAL HIGH (ref 6–23)
CO2: 27 mEq/L (ref 19–32)
Calcium: 8.7 mg/dL (ref 8.4–10.5)
Chloride: 106 mEq/L (ref 96–112)
Creat: 0.64 mg/dL (ref 0.50–1.10)
GLUCOSE: 67 mg/dL — AB (ref 70–99)
POTASSIUM: 4.4 meq/L (ref 3.5–5.3)
SODIUM: 142 meq/L (ref 135–145)
Total Protein: 6.3 g/dL (ref 6.0–8.3)

## 2013-12-02 LAB — LIPID PANEL
Cholesterol: 182 mg/dL (ref 0–200)
HDL: 76 mg/dL (ref >39)
LDL CALC: 86 mg/dL (ref 0–99)
TRIGLYCERIDES: 101 mg/dL (ref <150)
Total CHOL/HDL Ratio: 2.4 Ratio
VLDL: 20 mg/dL (ref 0–40)

## 2013-12-02 LAB — TSH: TSH: 1.091 u[IU]/mL (ref 0.350–4.500)

## 2013-12-02 MED ORDER — ESTRADIOL 1 MG PO TABS: 1.0000 mg | ORAL_TABLET | Freq: Two times a day (BID) | ORAL | Status: DC

## 2013-12-02 NOTE — Addendum Note (Signed)
Addended by: SPANGLER, NICHOLE on: 12/02/2013 03:50 PM ° ° Modules accepted: Orders ° °

## 2013-12-02 NOTE — Addendum Note (Signed)
Addended by: Dayna BarkerGARDNER, Tris Howell K on: 12/02/2013 03:24 PM   Modules accepted: Orders, SmartSet

## 2013-12-02 NOTE — Patient Instructions (Signed)
Try the higher dose of estrogen taking 1 pill twice a day and will see how you do as far as symptoms. Let me know if you have any issues with this.  You may obtain a copy of any labs that were done today by logging onto MyChart as outlined in the instructions provided with your AVS (after visit summary). The office will not call with normal lab results but certainly if there are any significant abnormalities then we will contact you.   Health Maintenance, Female A healthy lifestyle and preventative care can promote health and wellness.  Maintain regular health, dental, and eye exams.  Eat a healthy diet. Foods like vegetables, fruits, whole grains, low-fat dairy products, and lean protein foods contain the nutrients you need without too many calories. Decrease your intake of foods high in solid fats, added sugars, and salt. Get information about a proper diet from your caregiver, if necessary.  Regular physical exercise is one of the most important things you can do for your health. Most adults should get at least 150 minutes of moderate-intensity exercise (any activity that increases your heart rate and causes you to sweat) each week. In addition, most adults need muscle-strengthening exercises on 2 or more days a week.   Maintain a healthy weight. The body mass index (BMI) is a screening tool to identify possible weight problems. It provides an estimate of body fat based on height and weight. Your caregiver can help determine your BMI, and can help you achieve or maintain a healthy weight. For adults 20 years and older:  A BMI below 18.5 is considered underweight.  A BMI of 18.5 to 24.9 is normal.  A BMI of 25 to 29.9 is considered overweight.  A BMI of 30 and above is considered obese.  Maintain normal blood lipids and cholesterol by exercising and minimizing your intake of saturated fat. Eat a balanced diet with plenty of fruits and vegetables. Blood tests for lipids and cholesterol  should begin at age 20 and be repeated every 5 years. If your lipid or cholesterol levels are high, you are over 50, or you are a high risk for heart disease, you may need your cholesterol levels checked more frequently.Ongoing high lipid and cholesterol levels should be treated with medicines if diet and exercise are not effective.  If you smoke, find out from your caregiver how to quit. If you do not use tobacco, do not start.  Lung cancer screening is recommended for adults aged 71 80 years who are at high risk for developing lung cancer because of a history of smoking. Yearly low-dose computed tomography (CT) is recommended for people who have at least a 30-pack-year history of smoking and are a current smoker or have quit within the past 15 years. A pack year of smoking is smoking an average of 1 pack of cigarettes a day for 1 year (for example: 1 pack a day for 30 years or 2 packs a day for 15 years). Yearly screening should continue until the smoker has stopped smoking for at least 15 years. Yearly screening should also be stopped for people who develop a health problem that would prevent them from having lung cancer treatment.  If you are pregnant, do not drink alcohol. If you are breastfeeding, be very cautious about drinking alcohol. If you are not pregnant and choose to drink alcohol, do not exceed 1 drink per day. One drink is considered to be 12 ounces (355 mL) of beer, 5 ounces (148  mL) of wine, or 1.5 ounces (44 mL) of liquor.  Avoid use of street drugs. Do not share needles with anyone. Ask for help if you need support or instructions about stopping the use of drugs.  High blood pressure causes heart disease and increases the risk of stroke. Blood pressure should be checked at least every 1 to 2 years. Ongoing high blood pressure should be treated with medicines, if weight loss and exercise are not effective.  If you are 31 to 55 years old, ask your caregiver if you should take aspirin  to prevent strokes.  Diabetes screening involves taking a blood sample to check your fasting blood sugar level. This should be done once every 3 years, after age 65, if you are within normal weight and without risk factors for diabetes. Testing should be considered at a younger age or be carried out more frequently if you are overweight and have at least 1 risk factor for diabetes.  Breast cancer screening is essential preventative care for women. You should practice "breast self-awareness." This means understanding the normal appearance and feel of your breasts and may include breast self-examination. Any changes detected, no matter how small, should be reported to a caregiver. Women in their 32s and 30s should have a clinical breast exam (CBE) by a caregiver as part of a regular health exam every 1 to 3 years. After age 32, women should have a CBE every year. Starting at age 59, women should consider having a mammogram (breast X-ray) every year. Women who have a family history of breast cancer should talk to their caregiver about genetic screening. Women at a high risk of breast cancer should talk to their caregiver about having an MRI and a mammogram every year.  Breast cancer gene (BRCA)-related cancer risk assessment is recommended for women who have family members with BRCA-related cancers. BRCA-related cancers include breast, ovarian, tubal, and peritoneal cancers. Having family members with these cancers may be associated with an increased risk for harmful changes (mutations) in the breast cancer genes BRCA1 and BRCA2. Results of the assessment will determine the need for genetic counseling and BRCA1 and BRCA2 testing.  The Pap test is a screening test for cervical cancer. Women should have a Pap test starting at age 45. Between ages 64 and 26, Pap tests should be repeated every 2 years. Beginning at age 39, you should have a Pap test every 3 years as long as the past 3 Pap tests have been normal. If  you had a hysterectomy for a problem that was not cancer or a condition that could lead to cancer, then you no longer need Pap tests. If you are between ages 12 and 6, and you have had normal Pap tests going back 10 years, you no longer need Pap tests. If you have had past treatment for cervical cancer or a condition that could lead to cancer, you need Pap tests and screening for cancer for at least 20 years after your treatment. If Pap tests have been discontinued, risk factors (such as a new sexual partner) need to be reassessed to determine if screening should be resumed. Some women have medical problems that increase the chance of getting cervical cancer. In these cases, your caregiver may recommend more frequent screening and Pap tests.  The human papillomavirus (HPV) test is an additional test that may be used for cervical cancer screening. The HPV test looks for the virus that can cause the cell changes on the cervix. The cells  collected during the Pap test can be tested for HPV. The HPV test could be used to screen women aged 12 years and older, and should be used in women of any age who have unclear Pap test results. After the age of 101, women should have HPV testing at the same frequency as a Pap test.  Colorectal cancer can be detected and often prevented. Most routine colorectal cancer screening begins at the age of 32 and continues through age 33. However, your caregiver may recommend screening at an earlier age if you have risk factors for colon cancer. On a yearly basis, your caregiver may provide home test kits to check for hidden blood in the stool. Use of a small camera at the end of a tube, to directly examine the colon (sigmoidoscopy or colonoscopy), can detect the earliest forms of colorectal cancer. Talk to your caregiver about this at age 14, when routine screening begins. Direct examination of the colon should be repeated every 5 to 10 years through age 62, unless early forms of  pre-cancerous polyps or small growths are found.  Hepatitis C blood testing is recommended for all people born from [birth year] through 1965 and any individual with known risks for hepatitis C.  Practice safe sex. Use condoms and avoid high-risk sexual practices to reduce the spread of sexually transmitted infections (STIs). Sexually active women aged 67 and younger should be checked for Chlamydia, which is a common sexually transmitted infection. Older women with new or multiple partners should also be tested for Chlamydia. Testing for other STIs is recommended if you are sexually active and at increased risk.  Osteoporosis is a disease in which the bones lose minerals and strength with aging. This can result in serious bone fractures. The risk of osteoporosis can be identified using a bone density scan. Women ages 48 and over and women at risk for fractures or osteoporosis should discuss screening with their caregivers. Ask your caregiver whether you should be taking a calcium supplement or vitamin D to reduce the rate of osteoporosis.  Menopause can be associated with physical symptoms and risks. Hormone replacement therapy is available to decrease symptoms and risks. You should talk to your caregiver about whether hormone replacement therapy is right for you.  Use sunscreen. Apply sunscreen liberally and repeatedly throughout the day. You should seek shade when your shadow is shorter than you. Protect yourself by wearing long sleeves, pants, a wide-brimmed hat, and sunglasses year round, whenever you are outdoors.  Notify your caregiver of new moles or changes in moles, especially if there is a change in shape or color. Also notify your caregiver if a mole is larger than the size of a pencil eraser.  Stay current with your immunizations. Document Released: 07/10/2010 Document Revised: 04/21/2012 Document Reviewed: 07/10/2010 [facility] Patient Information 2014 [place].

## 2013-12-02 NOTE — Addendum Note (Signed)
Addended by: Dayna BarkerGARDNER, Gidget Quizhpi K on: 12/02/2013 10:40 AM   Modules accepted: Orders, SmartSet

## 2013-12-02 NOTE — Progress Notes (Signed)
Pamela ParisMonica H Pittman 06/24/1960 [id number]        53 y.o.  G0P0 for annual exam.  Several issues noted below.  Past medical history,surgical history, problem list, medications, allergies, family history and social history were all reviewed and documented as reviewed in the EPIC chart.  ROS:  12 system ROS performed with pertinent positives and negatives included in the history, assessment and plan.   Additional significant findings :  none   Exam: [facility] personassistant Filed Vitals:   12/02/13 1002  BP: 134/80  Height: 5\' 4"  (1.626 m)  Weight: 178 lb (80.74 kg)   General appearance:  Normal affect, orientation and appearance. Skin: Grossly normal HEENT: Without gross lesions.  No cervical or supraclavicular adenopathy. Thyroid normal.  Lungs:  Clear without wheezing, rales or rhonchi Cardiac: RR, without RMG Abdominal:  Soft, nontender, without masses, guarding, rebound, organomegaly or hernia Breasts:  Examined lying and sitting without masses, retractions, discharge or axillary adenopathy. Pelvic:  Ext/BUS/vagina normal. Pap of cuff done. Mild atrophic changes noted.  Adnexa  Without masses or tenderness    Anus and perineum  Normal   Rectovaginal  Normal sphincter tone without palpated masses or tenderness.    Assessment/Plan:  53 y.o. G0P0 female for annual exam.   1. Menopausal symptoms/HRT. Patient on estradiol 1.5 mg daily. Still having significant hot flashes. Reviewed options and at this point we'll increase her to 2 mg daily. I again reviewed the risks of HRT to include increased risk of stroke heart attack DVT and breast cancer. The issues of weaning and went to do this reviewed. Patient will let me know if she has any issues after increasing her dose. 2. Mammography 07/2013. Continue with annual mammography. SBE monthly reviewed. 3. Pap smear 2012. Pap of cuff done today. Reviewed current screening guidelines. Is status post hysterectomy for benign indications. Options to stop  screening altogether reviewed. Will readdress on an annual basis. 4. Colonoscopy 2014. Repeat at their recommended interval. 5. DEXA never. Will plan further into the menopause as she is on HRT. Check Vitamin D today. 6. Occasional migraines. Asked for refill of her Imitrex nasal. She is unsure of the dose and will call back with that information to order. No significant auras. 7. Occasional use of Xanax for anxiety/sleep. Still has a supply from Dr. Verl DickerGottsegen's prescription 2 years ago. Will call me if she needs refill. 8. Health maintenance.  Patient looking for new PCP. Baseline CBC comprehensive metabolic panel lipid profile urinalysis TSH vitamin D ordered. Follow up in one year, sooner as needed.     Dara LordsFONTAINE,Amery Minasyan P MD, 10:31 AM 12/02/2013

## 2013-12-03 LAB — URINALYSIS W MICROSCOPIC + REFLEX CULTURE
Bilirubin Urine: NEGATIVE
CASTS: NONE SEEN
Crystals: NONE SEEN
Glucose, UA: NEGATIVE mg/dL
HGB URINE DIPSTICK: NEGATIVE
Ketones, ur: NEGATIVE mg/dL
Leukocytes, UA: NEGATIVE
NITRITE: NEGATIVE
PROTEIN: NEGATIVE mg/dL
Specific Gravity, Urine: >1.03 — ABNORMAL HIGH (ref 1.005–1.030)
Urobilinogen, UA: 0.2 mg/dL (ref 0.0–1.0)
pH: 5.5 (ref 5.0–8.0)

## 2013-12-03 LAB — VITAMIN D 25 HYDROXY (VIT D DEFICIENCY, FRACTURES): Vit D, 25-Hydroxy: 38 ng/mL (ref 30–100)

## 2013-12-07 LAB — CYTOLOGY - PAP

## 2013-12-09 ENCOUNTER — Telehealth: Payer: Self-pay | Admitting: *Deleted

## 2013-12-09 MED ORDER — SUMATRIPTAN 20 MG/ACT NA SOLN: 20.0000 mg | NASAL | Status: AC | PRN

## 2013-12-09 NOTE — Telephone Encounter (Signed)
Okay to refill? 

## 2013-12-09 NOTE — Telephone Encounter (Signed)
Rx called in, pt informed

## 2013-12-09 NOTE — Telephone Encounter (Signed)
Pt was told to call back with dose on Imitrex nasal spray which is 20 mg. Rx will be sent per office note on 12/02/13.

## 2013-12-25 ENCOUNTER — Telehealth: Payer: Self-pay

## 2013-12-25 MED ORDER — SUMATRIPTAN 20 MG/ACT NA SOLN: 20.0000 mg | NASAL | Status: DC | PRN

## 2013-12-25 NOTE — Telephone Encounter (Signed)
Can use branded spray. Apparently this is what works best for the patient. Alternative would be to use oral Imitrex 50 mg one by mouth with onset of headache may repeat in 2 hours. Not to exceed 2 pills in 24 hours #10

## 2013-12-25 NOTE — Telephone Encounter (Signed)
Pharmacy sent a note that "generic Imitrex 20 mg nasal spray is on backorder.  Can we order something else for the patient?

## 2013-12-25 NOTE — Telephone Encounter (Signed)
I spoke with patient and she wanted to try brand at $65. She said she had used oral Imitrex before and it did not help her. Rx called in .

## 2014-01-08 DIAGNOSIS — M8580 Other specified disorders of bone density and structure, unspecified site: Secondary | ICD-10-CM

## 2014-01-08 HISTORY — DX: Other specified disorders of bone density and structure, unspecified site: M85.80

## 2014-01-19 ENCOUNTER — Telehealth: Payer: Self-pay | Admitting: *Deleted

## 2014-01-19 NOTE — Telephone Encounter (Signed)
PA done online at cover my meds for sumatriptan 5mg  nasal spray #6

## 2014-01-25 NOTE — Telephone Encounter (Signed)
Sumatriptain Nasal spray denied because alternative medications must be tried from pt formulary.

## 2014-05-14 ENCOUNTER — Telehealth: Payer: Self-pay | Admitting: *Deleted

## 2014-05-14 NOTE — Telephone Encounter (Signed)
Prior authorization [facility] out [facility], will wait for response.

## 2014-05-19 NOTE — Telephone Encounter (Signed)
Medication approved effective 01/19/2014-01/07/2038 reference #[id number]. Left on pt voicemail Rx approved

## 2014-06-04 ENCOUNTER — Other Ambulatory Visit: Payer: Self-pay

## 2014-06-04 DIAGNOSIS — Z1231 Encounter for screening mammogram for malignant neoplasm of breast: Secondary | ICD-10-CM

## 2014-08-17 ENCOUNTER — Ambulatory Visit
Admission: RE | Admit: 2014-08-17 | Discharge: 2014-08-17 | Disposition: A | Discharge status: Home / Self Care | Payer: BLUE CROSS/BLUE SHIELD | Source: Ambulatory Visit

## 2014-08-17 DIAGNOSIS — Z1231 Encounter for screening mammogram for malignant neoplasm of breast: Secondary | ICD-10-CM

## 2014-12-22 ENCOUNTER — Ambulatory Visit (INDEPENDENT_AMBULATORY_CARE_PROVIDER_SITE_OTHER): Payer: BLUE CROSS/BLUE SHIELD | Admitting: Gynecology

## 2014-12-22 ENCOUNTER — Encounter: Payer: Self-pay | Admitting: Gynecology

## 2014-12-22 VITALS — BP 124/78 | Ht 64.0 in | Wt 185.0 lb

## 2014-12-22 DIAGNOSIS — Z01419 Encounter for gynecological examination (general) (routine) without abnormal findings: Secondary | ICD-10-CM | POA: Diagnosis not present

## 2014-12-22 DIAGNOSIS — Z79890 Hormone replacement therapy (postmenopausal): Secondary | ICD-10-CM | POA: Diagnosis not present

## 2014-12-22 DIAGNOSIS — M8580 Other specified disorders of bone density and structure, unspecified site: Secondary | ICD-10-CM | POA: Diagnosis not present

## 2014-12-22 MED ORDER — ESTRADIOL 1 MG PO TABS: 1.0000 mg | ORAL_TABLET | Freq: Two times a day (BID) | ORAL | Status: DC

## 2014-12-22 NOTE — Patient Instructions (Signed)

## 2014-12-22 NOTE — Progress Notes (Addendum)
Currie ParisMonica H Denherder 01-02-1961 [id number]        54 y.o.  G0P0  for annual exam.  Several issues noted below.  Past medical history,surgical history, problem list, medications, allergies, family history and social history were all reviewed and documented as reviewed in the EPIC chart.  ROS:  Performed with pertinent positives and negatives included in the history, assessment and plan.   Additional significant findings :  none   Exam: [facility] personassistant Filed Vitals:   12/22/14 1042  BP: 124/78  Height: 5\' 4"  (1.626 m)  Weight: 185 lb (83.915 kg)   General appearance:  Normal affect, orientation and appearance. Skin: Grossly normal HEENT: Without gross lesions.  No cervical or supraclavicular adenopathy. Thyroid normal.  Lungs:  Clear without wheezing, rales or rhonchi Cardiac: RR, without RMG Abdominal:  Soft, nontender, without masses, guarding, rebound, organomegaly or hernia Breasts:  Examined lying and sitting without masses, retractions, discharge or axillary adenopathy. Pelvic:  Ext/BUS/vagina normal  Adnexa  Without masses or tenderness    Anus and perineum  Normal   Rectovaginal  Normal sphincter tone without palpated masses or tenderness.    Assessment/Plan:  [age] y.o. G0P0 female for annual exam.   1. [facility]. [facility]. Continues on Estrace 2 mg daily in a divided dose. Has tried weaning with unacceptable hot flashes and sweats. I again reviewed the whole issue of HRT with her to include the WHI study, increased risks of stroke heart attack DVT breast cancer. Current recommendations for lowest dose for shortest period of time reviewed. At this point the patient wants to continue and I refilled her 1 year. 2. Osteopenia. DEXA this past year after a fracture and was told she had osteopenia. I asked her to get a copy of this report for me. Is taking extra vitamin D and calcium. Being followed by her primary physician for this. 3. Mammography 08/2014.  Continue with annual mammography when due.  SBE monthly reviewed. 4. Colonoscopy 2014. Repeat at their recommended interval. 5. Pap smear 2015. No Pap smear done today. Discussed options to stop screening as she is status post hysterectomy for benign indications versus less frequent screening intervals. Will readdress on an annual basis. 6. I have provided in the past refills for Xanax and Imitrex for anxiety related to flying and migraine headaches. Patient does not require refills at this time as she reports supplies at home but may call for refills as needed. 7. Health maintenance. No routine lab work done as this is reportedly done at her primary physician's office. Follow up 1 year, sooner as needed.   Dara LordsFONTAINE,Jeaneen Cala P MD, 11:01 AM 12/22/2014

## 2014-12-30 ENCOUNTER — Encounter: Payer: Self-pay | Admitting: Gynecology

## 2015-01-04 ENCOUNTER — Encounter: Payer: Self-pay | Admitting: Gynecology

## 2015-07-19 ENCOUNTER — Other Ambulatory Visit: Payer: Self-pay | Admitting: Gynecology

## 2015-07-19 DIAGNOSIS — Z1231 Encounter for screening mammogram for malignant neoplasm of breast: Secondary | ICD-10-CM

## 2015-08-18 ENCOUNTER — Ambulatory Visit: Payer: BLUE CROSS/BLUE SHIELD

## 2015-08-25 ENCOUNTER — Ambulatory Visit
Admission: RE | Admit: 2015-08-25 | Discharge: 2015-08-25 | Disposition: A | Discharge status: Home / Self Care | Payer: BLUE CROSS/BLUE SHIELD | Source: Ambulatory Visit | Attending: Gynecology | Admitting: Gynecology

## 2015-08-25 DIAGNOSIS — Z1231 Encounter for screening mammogram for malignant neoplasm of breast: Secondary | ICD-10-CM

## 2015-12-26 ENCOUNTER — Encounter: Payer: Self-pay | Admitting: Gynecology

## 2015-12-26 ENCOUNTER — Ambulatory Visit (INDEPENDENT_AMBULATORY_CARE_PROVIDER_SITE_OTHER): Payer: BLUE CROSS/BLUE SHIELD | Admitting: Gynecology

## 2015-12-26 VITALS — BP 120/78 | Ht 64.0 in | Wt 186.0 lb

## 2015-12-26 DIAGNOSIS — Z01411 Encounter for gynecological examination (general) (routine) with abnormal findings: Secondary | ICD-10-CM | POA: Diagnosis not present

## 2015-12-26 DIAGNOSIS — Z79890 Hormone replacement therapy (postmenopausal): Secondary | ICD-10-CM

## 2015-12-26 DIAGNOSIS — N952 Postmenopausal atrophic vaginitis: Secondary | ICD-10-CM | POA: Diagnosis not present

## 2015-12-26 MED ORDER — ESTRADIOL 1 MG PO TABS: 1.0000 mg | ORAL_TABLET | Freq: Every day | ORAL | 4 refills | Status: DC

## 2015-12-26 NOTE — Patient Instructions (Signed)

## 2015-12-26 NOTE — Progress Notes (Signed)
    Pamela ParisMonica H Pittman [birth year] [id number]        55 y.o.  G0P0 for annual exam.    Past medical history,surgical history, problem list, medications, allergies, family history and social history were all reviewed and documented as reviewed in the EPIC chart.  ROS:  Performed with pertinent positives and negatives included in the history, assessment and plan.   Additional significant findings :  None   Exam: Kennon PortelaKim Gardner assistant Vitals:   12/26/15 1401  BP: 120/78  Weight: 186 lb (84.4 kg)  Height: 5\' 4"  (1.626 m)   Body mass index is 31.93 kg/m.  General appearance:  Normal affect, orientation and appearance. Skin: Grossly normal HEENT: Without gross lesions.  No cervical or supraclavicular adenopathy. Thyroid normal.  Lungs:  Clear without wheezing, rales or rhonchi Cardiac: RR, without RMG Abdominal:  Soft, nontender, without masses, guarding, rebound, organomegaly or hernia Breasts:  Examined lying and sitting without masses, retractions, discharge or axillary adenopathy. Pelvic:  Ext, BUS, Vagina with atrophic changes  Adnexa without masses or tenderness    Anus and perineum normal   Rectovaginal normal sphincter tone without palpated masses or tenderness.    Assessment/Plan:  55 y.o. G0P0 female for annual exam.   1. HRT. 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 Continues on Estrace 2 mg daily in divided dose. Wants to know if she can stop this. I reviewed the whole issue of HRT to include the most recent NAMS 2017 guidelines. Risks to include stroke heart attack DVT and possible breast cancer versus benefits of symptom relief and possible bone health and cardiovascular when started early. Will decrease to 1 mg daily of the next month to 2 months. If does well with this them will take 1/2 mg daily for another 2 months and then ultimately stop this if she still desires to do so and is not having significant symptoms. 2. Osteopenia. DEXA 12/2014 T score -1.9. FRAX was not done.  Will repeat next year to year interval. Increased calcium vitamin D reviewed. 3. Pap smear 11/2013. No Pap smear done today. No history of significant abnormal Pap smears.  I reviewed current screening guidelines and based upon hysterectomy history options to stop screening altogether discussed. Will readdress on annual basis. 4. Colonoscopy 2014. Repeat at their recommended interval. 5. Mammography 08/2015. Continue with annual mammography when due. SBE monthly reviewed. 6. Health maintenance. No routine lab work done as patient reports is done elsewhere. I have in the past refilled her Xanax for flying and Imitrex for her headaches. Does not request refills at this time. Follow up 1 year, sooner as needed.   Dara LordsFONTAINE,Pamela Pittman P MD, 2:44 PM 12/26/2015

## 2016-05-15 DIAGNOSIS — J3089 Other allergic rhinitis: Secondary | ICD-10-CM | POA: Diagnosis not present

## 2016-05-15 DIAGNOSIS — J301 Allergic rhinitis due to pollen: Secondary | ICD-10-CM | POA: Diagnosis not present

## 2016-05-16 DIAGNOSIS — H52223 Regular astigmatism, bilateral: Secondary | ICD-10-CM | POA: Diagnosis not present

## 2016-05-16 DIAGNOSIS — H16223 Keratoconjunctivitis sicca, not specified as Sjogren's, bilateral: Secondary | ICD-10-CM | POA: Diagnosis not present

## 2016-05-28 DIAGNOSIS — J301 Allergic rhinitis due to pollen: Secondary | ICD-10-CM | POA: Diagnosis not present

## 2016-05-28 DIAGNOSIS — J3089 Other allergic rhinitis: Secondary | ICD-10-CM | POA: Diagnosis not present

## 2016-05-29 DIAGNOSIS — M722 Plantar fascial fibromatosis: Secondary | ICD-10-CM | POA: Diagnosis not present

## 2016-06-12 DIAGNOSIS — J301 Allergic rhinitis due to pollen: Secondary | ICD-10-CM | POA: Diagnosis not present

## 2016-06-12 DIAGNOSIS — J3089 Other allergic rhinitis: Secondary | ICD-10-CM | POA: Diagnosis not present

## 2016-06-25 DIAGNOSIS — J3089 Other allergic rhinitis: Secondary | ICD-10-CM | POA: Diagnosis not present

## 2016-06-25 DIAGNOSIS — J301 Allergic rhinitis due to pollen: Secondary | ICD-10-CM | POA: Diagnosis not present

## 2016-06-25 DIAGNOSIS — J4520 Mild intermittent asthma, uncomplicated: Secondary | ICD-10-CM | POA: Diagnosis not present

## 2016-07-10 DIAGNOSIS — J3089 Other allergic rhinitis: Secondary | ICD-10-CM | POA: Diagnosis not present

## 2016-07-10 DIAGNOSIS — J301 Allergic rhinitis due to pollen: Secondary | ICD-10-CM | POA: Diagnosis not present

## 2016-07-16 ENCOUNTER — Other Ambulatory Visit: Payer: Self-pay | Admitting: Gynecology

## 2016-07-16 DIAGNOSIS — Z1231 Encounter for screening mammogram for malignant neoplasm of breast: Secondary | ICD-10-CM

## 2016-07-23 DIAGNOSIS — J301 Allergic rhinitis due to pollen: Secondary | ICD-10-CM | POA: Diagnosis not present

## 2016-07-23 DIAGNOSIS — J3089 Other allergic rhinitis: Secondary | ICD-10-CM | POA: Diagnosis not present

## 2016-08-06 DIAGNOSIS — J301 Allergic rhinitis due to pollen: Secondary | ICD-10-CM | POA: Diagnosis not present

## 2016-08-06 DIAGNOSIS — J3089 Other allergic rhinitis: Secondary | ICD-10-CM | POA: Diagnosis not present

## 2016-08-09 DIAGNOSIS — M7661 Achilles tendinitis, right leg: Secondary | ICD-10-CM | POA: Diagnosis not present

## 2016-08-09 DIAGNOSIS — M722 Plantar fascial fibromatosis: Secondary | ICD-10-CM | POA: Diagnosis not present

## 2016-08-13 DIAGNOSIS — R0602 Shortness of breath: Secondary | ICD-10-CM | POA: Diagnosis not present

## 2016-08-13 DIAGNOSIS — Z1159 Encounter for screening for other viral diseases: Secondary | ICD-10-CM | POA: Diagnosis not present

## 2016-08-13 DIAGNOSIS — I10 Essential (primary) hypertension: Secondary | ICD-10-CM | POA: Diagnosis not present

## 2016-08-13 DIAGNOSIS — F410 Panic disorder [episodic paroxysmal anxiety] without agoraphobia: Secondary | ICD-10-CM | POA: Diagnosis not present

## 2016-08-13 DIAGNOSIS — Z6832 Body mass index (BMI) 32.0-32.9, adult: Secondary | ICD-10-CM | POA: Diagnosis not present

## 2016-08-20 DIAGNOSIS — J3089 Other allergic rhinitis: Secondary | ICD-10-CM | POA: Diagnosis not present

## 2016-08-20 DIAGNOSIS — J301 Allergic rhinitis due to pollen: Secondary | ICD-10-CM | POA: Diagnosis not present

## 2016-08-27 ENCOUNTER — Ambulatory Visit
Admission: RE | Admit: 2016-08-27 | Discharge: 2016-08-27 | Disposition: A | Discharge status: Home / Self Care | Payer: BLUE CROSS/BLUE SHIELD | Source: Ambulatory Visit | Attending: Gynecology | Admitting: Gynecology

## 2016-08-27 DIAGNOSIS — Z1231 Encounter for screening mammogram for malignant neoplasm of breast: Secondary | ICD-10-CM | POA: Diagnosis not present

## 2016-09-03 DIAGNOSIS — J3089 Other allergic rhinitis: Secondary | ICD-10-CM | POA: Diagnosis not present

## 2016-09-03 DIAGNOSIS — J301 Allergic rhinitis due to pollen: Secondary | ICD-10-CM | POA: Diagnosis not present

## 2016-09-12 DIAGNOSIS — H16223 Keratoconjunctivitis sicca, not specified as Sjogren's, bilateral: Secondary | ICD-10-CM | POA: Diagnosis not present

## 2016-09-17 DIAGNOSIS — J301 Allergic rhinitis due to pollen: Secondary | ICD-10-CM | POA: Diagnosis not present

## 2016-09-17 DIAGNOSIS — J3089 Other allergic rhinitis: Secondary | ICD-10-CM | POA: Diagnosis not present

## 2016-09-18 DIAGNOSIS — J301 Allergic rhinitis due to pollen: Secondary | ICD-10-CM | POA: Diagnosis not present

## 2016-09-18 DIAGNOSIS — J3089 Other allergic rhinitis: Secondary | ICD-10-CM | POA: Diagnosis not present

## 2016-10-02 DIAGNOSIS — J3089 Other allergic rhinitis: Secondary | ICD-10-CM | POA: Diagnosis not present

## 2016-10-02 DIAGNOSIS — J301 Allergic rhinitis due to pollen: Secondary | ICD-10-CM | POA: Diagnosis not present

## 2016-10-04 DIAGNOSIS — M722 Plantar fascial fibromatosis: Secondary | ICD-10-CM | POA: Diagnosis not present

## 2016-10-15 DIAGNOSIS — G43009 Migraine without aura, not intractable, without status migrainosus: Secondary | ICD-10-CM | POA: Diagnosis not present

## 2016-10-15 DIAGNOSIS — Z23 Encounter for immunization: Secondary | ICD-10-CM | POA: Diagnosis not present

## 2016-10-15 DIAGNOSIS — I10 Essential (primary) hypertension: Secondary | ICD-10-CM | POA: Diagnosis not present

## 2016-10-15 DIAGNOSIS — Z6832 Body mass index (BMI) 32.0-32.9, adult: Secondary | ICD-10-CM | POA: Diagnosis not present

## 2016-10-16 DIAGNOSIS — J3089 Other allergic rhinitis: Secondary | ICD-10-CM | POA: Diagnosis not present

## 2016-10-16 DIAGNOSIS — J301 Allergic rhinitis due to pollen: Secondary | ICD-10-CM | POA: Diagnosis not present

## 2016-10-30 DIAGNOSIS — J3089 Other allergic rhinitis: Secondary | ICD-10-CM | POA: Diagnosis not present

## 2016-10-30 DIAGNOSIS — J301 Allergic rhinitis due to pollen: Secondary | ICD-10-CM | POA: Diagnosis not present

## 2016-11-12 DIAGNOSIS — J3089 Other allergic rhinitis: Secondary | ICD-10-CM | POA: Diagnosis not present

## 2016-11-12 DIAGNOSIS — J301 Allergic rhinitis due to pollen: Secondary | ICD-10-CM | POA: Diagnosis not present

## 2016-11-26 DIAGNOSIS — J3089 Other allergic rhinitis: Secondary | ICD-10-CM | POA: Diagnosis not present

## 2016-11-26 DIAGNOSIS — J301 Allergic rhinitis due to pollen: Secondary | ICD-10-CM | POA: Diagnosis not present

## 2016-12-10 DIAGNOSIS — J3089 Other allergic rhinitis: Secondary | ICD-10-CM | POA: Diagnosis not present

## 2016-12-10 DIAGNOSIS — J301 Allergic rhinitis due to pollen: Secondary | ICD-10-CM | POA: Diagnosis not present

## 2016-12-25 DIAGNOSIS — J3089 Other allergic rhinitis: Secondary | ICD-10-CM | POA: Diagnosis not present

## 2016-12-25 DIAGNOSIS — J301 Allergic rhinitis due to pollen: Secondary | ICD-10-CM | POA: Diagnosis not present

## 2016-12-27 ENCOUNTER — Ambulatory Visit: Payer: BLUE CROSS/BLUE SHIELD | Admitting: Gynecology

## 2016-12-27 ENCOUNTER — Encounter: Payer: Self-pay | Admitting: Gynecology

## 2016-12-27 VITALS — BP 124/80 | Ht 64.0 in | Wt 182.0 lb

## 2016-12-27 DIAGNOSIS — M8580 Other specified disorders of bone density and structure, unspecified site: Secondary | ICD-10-CM

## 2016-12-27 DIAGNOSIS — N952 Postmenopausal atrophic vaginitis: Secondary | ICD-10-CM

## 2016-12-27 DIAGNOSIS — Z01411 Encounter for gynecological examination (general) (routine) with abnormal findings: Secondary | ICD-10-CM

## 2016-12-27 DIAGNOSIS — Z79890 Hormone replacement therapy (postmenopausal): Secondary | ICD-10-CM | POA: Diagnosis not present

## 2016-12-27 MED ORDER — ESTRADIOL 1 MG PO TABS: 1.0000 mg | ORAL_TABLET | Freq: Every day | ORAL | 4 refills | Status: DC

## 2016-12-27 NOTE — Addendum Note (Signed)
Addended by: Dayna BarkerGARDNER, Rylan Kaufmann K on: 12/27/2016 12:35 PM   Modules accepted: Orders

## 2016-12-27 NOTE — Progress Notes (Signed)
    Currie ParisMonica H Coulibaly 08-14-1960 [id number]        56 y.o.  G0P0 for annual gynecologic exam.  Doing well without complaints  Past medical history,surgical history, problem list, medications, allergies, family history and social history were all reviewed and documented as reviewed in the EPIC chart.  ROS:  Performed with pertinent positives and negatives included in the history, assessment and plan.   Additional significant findings : None   Exam: Kennon PortelaKim Gardner assistant Vitals:   12/27/16 1047  BP: 124/80  Weight: 182 lb (82.6 kg)  Height: 5\' 4"  (1.626 m)   Body mass index is 31.24 kg/m.  General appearance:  Normal affect, orientation and appearance. Skin: Grossly normal HEENT: Without gross lesions.  No cervical or supraclavicular adenopathy. Thyroid normal.  Lungs:  Clear without wheezing, rales or rhonchi Cardiac: RR, without RMG Abdominal:  Soft, nontender, without masses, guarding, rebound, organomegaly or hernia Breasts:  Examined lying and sitting without masses, retractions, discharge or axillary adenopathy. Pelvic:  Ext, BUS, Vagina: Normal with atrophic changes.  Pap smear of cuff done  Adnexa: Without masses or tenderness    Anus and perineum: Normal   Rectovaginal: Normal sphincter tone without palpated masses or tenderness.    Assessment/Plan:  56 y.o. G0P0 female for annual gynecologic exam, status post [facility] for leiomyoma/DUB.   1. Postmenopausal/atrophic changes/HRT.  Continues on estradiol 1 mg daily.  Doing well with this.  Had been on 2 mg daily but decreased and is tolerating the 1 mg dose.  I again reviewed the risks versus benefits.  Thrombosis such as stroke heart attack DVT as well as the breast cancer issue discussed versus benefits to include cardiovascular and bone health as well as symptom relief.  At this point she wants to continue and I refilled her times 1 year. 2. Osteopenia.  DEXA 12/2014 T score -1.6.  FRAX not done.  Recommend follow-up DEXA  now a 2-year interval when she agrees to schedule.  Check vitamin D level today. 3. Pap smear 11/2013.  No history of abnormal Pap smears.  Options to stop screening per current screening guidelines based on hysterectomy history reviewed.  Pap smear of vaginal cuff done today.  Will readdress on an annual basis. 4. Mammography 08/2016.  Continue with annual mammography when due.  SBE monthly reviewed.  Breast exam normal today. 5. Colonoscopy 2014.  Repeat at their recommended interval. 6. Health maintenance.  No routine blood work done as patient reports this done elsewhere.  Follow-up 1 year, sooner as needed.   Dara Lordsimothy P Nyajah Hyson MD, 11:15 AM 12/27/2016

## 2016-12-27 NOTE — Patient Instructions (Signed)
Follow-up for bone density as scheduled.  Follow-up in 1 year for annual exam. 

## 2016-12-28 ENCOUNTER — Encounter: Payer: Self-pay | Admitting: Gynecology

## 2016-12-28 LAB — VITAMIN D 25 HYDROXY (VIT D DEFICIENCY, FRACTURES): Vit D, 25-Hydroxy: 78 ng/mL (ref 30–100)

## 2016-12-31 LAB — PAP IG W/ RFLX HPV ASCU

## 2017-01-09 DIAGNOSIS — J301 Allergic rhinitis due to pollen: Secondary | ICD-10-CM | POA: Diagnosis not present

## 2017-01-09 DIAGNOSIS — J3089 Other allergic rhinitis: Secondary | ICD-10-CM | POA: Diagnosis not present

## 2017-01-23 DIAGNOSIS — J301 Allergic rhinitis due to pollen: Secondary | ICD-10-CM | POA: Diagnosis not present

## 2017-01-23 DIAGNOSIS — J3089 Other allergic rhinitis: Secondary | ICD-10-CM | POA: Diagnosis not present

## 2017-01-31 ENCOUNTER — Ambulatory Visit (INDEPENDENT_AMBULATORY_CARE_PROVIDER_SITE_OTHER): Payer: BLUE CROSS/BLUE SHIELD

## 2017-01-31 ENCOUNTER — Encounter: Payer: Self-pay | Admitting: Gynecology

## 2017-01-31 ENCOUNTER — Other Ambulatory Visit: Payer: Self-pay | Admitting: Gynecology

## 2017-01-31 DIAGNOSIS — Z13820 Encounter for screening for osteoporosis: Secondary | ICD-10-CM | POA: Diagnosis not present

## 2017-01-31 DIAGNOSIS — M8580 Other specified disorders of bone density and structure, unspecified site: Secondary | ICD-10-CM

## 2017-01-31 DIAGNOSIS — M8589 Other specified disorders of bone density and structure, multiple sites: Secondary | ICD-10-CM

## 2017-02-05 DIAGNOSIS — J301 Allergic rhinitis due to pollen: Secondary | ICD-10-CM | POA: Diagnosis not present

## 2017-02-05 DIAGNOSIS — J3089 Other allergic rhinitis: Secondary | ICD-10-CM | POA: Diagnosis not present

## 2017-02-19 DIAGNOSIS — J301 Allergic rhinitis due to pollen: Secondary | ICD-10-CM | POA: Diagnosis not present

## 2017-02-19 DIAGNOSIS — J3089 Other allergic rhinitis: Secondary | ICD-10-CM | POA: Diagnosis not present

## 2017-02-23 DIAGNOSIS — J301 Allergic rhinitis due to pollen: Secondary | ICD-10-CM | POA: Diagnosis not present

## 2017-02-23 DIAGNOSIS — J3089 Other allergic rhinitis: Secondary | ICD-10-CM | POA: Diagnosis not present

## 2017-03-04 DIAGNOSIS — M25532 Pain in left wrist: Secondary | ICD-10-CM | POA: Diagnosis not present

## 2017-03-06 DIAGNOSIS — J3089 Other allergic rhinitis: Secondary | ICD-10-CM | POA: Diagnosis not present

## 2017-03-06 DIAGNOSIS — J301 Allergic rhinitis due to pollen: Secondary | ICD-10-CM | POA: Diagnosis not present

## 2017-03-06 DIAGNOSIS — J4520 Mild intermittent asthma, uncomplicated: Secondary | ICD-10-CM | POA: Diagnosis not present

## 2017-03-06 DIAGNOSIS — Z8719 Personal history of other diseases of the digestive system: Secondary | ICD-10-CM | POA: Diagnosis not present

## 2017-03-19 DIAGNOSIS — J301 Allergic rhinitis due to pollen: Secondary | ICD-10-CM | POA: Diagnosis not present

## 2017-03-19 DIAGNOSIS — J3089 Other allergic rhinitis: Secondary | ICD-10-CM | POA: Diagnosis not present

## 2017-03-29 DIAGNOSIS — M25532 Pain in left wrist: Secondary | ICD-10-CM | POA: Diagnosis not present

## 2017-04-09 DIAGNOSIS — J3089 Other allergic rhinitis: Secondary | ICD-10-CM | POA: Diagnosis not present

## 2017-04-09 DIAGNOSIS — J301 Allergic rhinitis due to pollen: Secondary | ICD-10-CM | POA: Diagnosis not present

## 2017-04-10 DIAGNOSIS — R29898 Other symptoms and signs involving the musculoskeletal system: Secondary | ICD-10-CM | POA: Diagnosis not present

## 2017-04-10 DIAGNOSIS — R600 Localized edema: Secondary | ICD-10-CM | POA: Diagnosis not present

## 2017-04-10 DIAGNOSIS — M25532 Pain in left wrist: Secondary | ICD-10-CM | POA: Diagnosis not present

## 2017-04-11 DIAGNOSIS — I10 Essential (primary) hypertension: Secondary | ICD-10-CM | POA: Diagnosis not present

## 2017-04-11 DIAGNOSIS — J209 Acute bronchitis, unspecified: Secondary | ICD-10-CM | POA: Diagnosis not present

## 2017-04-15 DIAGNOSIS — R6889 Other general symptoms and signs: Secondary | ICD-10-CM | POA: Diagnosis not present

## 2017-04-15 DIAGNOSIS — M25532 Pain in left wrist: Secondary | ICD-10-CM | POA: Diagnosis not present

## 2017-04-15 DIAGNOSIS — R29898 Other symptoms and signs involving the musculoskeletal system: Secondary | ICD-10-CM | POA: Diagnosis not present

## 2017-04-15 DIAGNOSIS — R600 Localized edema: Secondary | ICD-10-CM | POA: Diagnosis not present

## 2017-04-24 DIAGNOSIS — J301 Allergic rhinitis due to pollen: Secondary | ICD-10-CM | POA: Diagnosis not present

## 2017-04-24 DIAGNOSIS — J3089 Other allergic rhinitis: Secondary | ICD-10-CM | POA: Diagnosis not present

## 2017-04-24 DIAGNOSIS — R29898 Other symptoms and signs involving the musculoskeletal system: Secondary | ICD-10-CM | POA: Diagnosis not present

## 2017-04-24 DIAGNOSIS — R6889 Other general symptoms and signs: Secondary | ICD-10-CM | POA: Diagnosis not present

## 2017-04-24 DIAGNOSIS — M25532 Pain in left wrist: Secondary | ICD-10-CM | POA: Diagnosis not present

## 2017-05-06 DIAGNOSIS — M25532 Pain in left wrist: Secondary | ICD-10-CM | POA: Diagnosis not present

## 2017-05-06 DIAGNOSIS — R6889 Other general symptoms and signs: Secondary | ICD-10-CM | POA: Diagnosis not present

## 2017-05-06 DIAGNOSIS — R29898 Other symptoms and signs involving the musculoskeletal system: Secondary | ICD-10-CM | POA: Diagnosis not present

## 2017-05-07 DIAGNOSIS — J3089 Other allergic rhinitis: Secondary | ICD-10-CM | POA: Diagnosis not present

## 2017-05-07 DIAGNOSIS — J301 Allergic rhinitis due to pollen: Secondary | ICD-10-CM | POA: Diagnosis not present

## 2017-05-22 DIAGNOSIS — G43809 Other migraine, not intractable, without status migrainosus: Secondary | ICD-10-CM | POA: Diagnosis not present

## 2017-05-22 DIAGNOSIS — R51 Headache: Secondary | ICD-10-CM | POA: Diagnosis not present

## 2017-05-22 DIAGNOSIS — Z0184 Encounter for antibody response examination: Secondary | ICD-10-CM | POA: Diagnosis not present

## 2017-05-23 DIAGNOSIS — R51 Headache: Secondary | ICD-10-CM | POA: Diagnosis not present

## 2017-05-23 DIAGNOSIS — G43809 Other migraine, not intractable, without status migrainosus: Secondary | ICD-10-CM | POA: Diagnosis not present

## 2017-05-27 DIAGNOSIS — R29898 Other symptoms and signs involving the musculoskeletal system: Secondary | ICD-10-CM | POA: Diagnosis not present

## 2017-05-27 DIAGNOSIS — J301 Allergic rhinitis due to pollen: Secondary | ICD-10-CM | POA: Diagnosis not present

## 2017-05-27 DIAGNOSIS — J3089 Other allergic rhinitis: Secondary | ICD-10-CM | POA: Diagnosis not present

## 2017-05-27 DIAGNOSIS — M25532 Pain in left wrist: Secondary | ICD-10-CM | POA: Diagnosis not present

## 2017-06-11 DIAGNOSIS — J301 Allergic rhinitis due to pollen: Secondary | ICD-10-CM | POA: Diagnosis not present

## 2017-06-11 DIAGNOSIS — J3089 Other allergic rhinitis: Secondary | ICD-10-CM | POA: Diagnosis not present

## 2017-06-25 DIAGNOSIS — J301 Allergic rhinitis due to pollen: Secondary | ICD-10-CM | POA: Diagnosis not present

## 2017-06-25 DIAGNOSIS — J3089 Other allergic rhinitis: Secondary | ICD-10-CM | POA: Diagnosis not present

## 2017-06-26 ENCOUNTER — Other Ambulatory Visit: Payer: Self-pay | Admitting: Gynecology

## 2017-06-26 DIAGNOSIS — Z1231 Encounter for screening mammogram for malignant neoplasm of breast: Secondary | ICD-10-CM

## 2017-07-03 DIAGNOSIS — R202 Paresthesia of skin: Secondary | ICD-10-CM | POA: Diagnosis not present

## 2017-07-03 DIAGNOSIS — G43809 Other migraine, not intractable, without status migrainosus: Secondary | ICD-10-CM | POA: Diagnosis not present

## 2017-07-10 DIAGNOSIS — J301 Allergic rhinitis due to pollen: Secondary | ICD-10-CM | POA: Diagnosis not present

## 2017-07-10 DIAGNOSIS — J3089 Other allergic rhinitis: Secondary | ICD-10-CM | POA: Diagnosis not present

## 2017-07-22 DIAGNOSIS — H524 Presbyopia: Secondary | ICD-10-CM | POA: Diagnosis not present

## 2017-07-29 DIAGNOSIS — J301 Allergic rhinitis due to pollen: Secondary | ICD-10-CM | POA: Diagnosis not present

## 2017-07-29 DIAGNOSIS — J3089 Other allergic rhinitis: Secondary | ICD-10-CM | POA: Diagnosis not present

## 2017-08-14 DIAGNOSIS — J3089 Other allergic rhinitis: Secondary | ICD-10-CM | POA: Diagnosis not present

## 2017-08-14 DIAGNOSIS — J301 Allergic rhinitis due to pollen: Secondary | ICD-10-CM | POA: Diagnosis not present

## 2017-08-17 DIAGNOSIS — J3089 Other allergic rhinitis: Secondary | ICD-10-CM | POA: Diagnosis not present

## 2017-08-17 DIAGNOSIS — J301 Allergic rhinitis due to pollen: Secondary | ICD-10-CM | POA: Diagnosis not present

## 2017-08-28 DIAGNOSIS — J301 Allergic rhinitis due to pollen: Secondary | ICD-10-CM | POA: Diagnosis not present

## 2017-08-28 DIAGNOSIS — J3089 Other allergic rhinitis: Secondary | ICD-10-CM | POA: Diagnosis not present

## 2017-09-02 ENCOUNTER — Ambulatory Visit
Admission: RE | Admit: 2017-09-02 | Discharge: 2017-09-02 | Disposition: A | Discharge status: Home / Self Care | Payer: BLUE CROSS/BLUE SHIELD | Source: Ambulatory Visit | Attending: Gynecology | Admitting: Gynecology

## 2017-09-02 DIAGNOSIS — Z1231 Encounter for screening mammogram for malignant neoplasm of breast: Secondary | ICD-10-CM | POA: Diagnosis not present

## 2017-09-05 DIAGNOSIS — J4520 Mild intermittent asthma, uncomplicated: Secondary | ICD-10-CM | POA: Diagnosis not present

## 2017-09-05 DIAGNOSIS — J301 Allergic rhinitis due to pollen: Secondary | ICD-10-CM | POA: Diagnosis not present

## 2017-09-05 DIAGNOSIS — J3089 Other allergic rhinitis: Secondary | ICD-10-CM | POA: Diagnosis not present

## 2017-09-16 DIAGNOSIS — J301 Allergic rhinitis due to pollen: Secondary | ICD-10-CM | POA: Diagnosis not present

## 2017-09-16 DIAGNOSIS — J3089 Other allergic rhinitis: Secondary | ICD-10-CM | POA: Diagnosis not present

## 2017-09-20 DIAGNOSIS — J209 Acute bronchitis, unspecified: Secondary | ICD-10-CM | POA: Diagnosis not present

## 2017-09-30 DIAGNOSIS — Z791 Long term (current) use of non-steroidal anti-inflammatories (NSAID): Secondary | ICD-10-CM | POA: Diagnosis not present

## 2017-09-30 DIAGNOSIS — Z23 Encounter for immunization: Secondary | ICD-10-CM | POA: Diagnosis not present

## 2017-09-30 DIAGNOSIS — R1013 Epigastric pain: Secondary | ICD-10-CM | POA: Diagnosis not present

## 2017-09-30 DIAGNOSIS — G43009 Migraine without aura, not intractable, without status migrainosus: Secondary | ICD-10-CM | POA: Diagnosis not present

## 2017-09-30 DIAGNOSIS — Z0000 Encounter for general adult medical examination without abnormal findings: Secondary | ICD-10-CM | POA: Diagnosis not present

## 2017-09-30 DIAGNOSIS — I10 Essential (primary) hypertension: Secondary | ICD-10-CM | POA: Diagnosis not present

## 2017-09-30 DIAGNOSIS — Z780 Asymptomatic menopausal state: Secondary | ICD-10-CM | POA: Diagnosis not present

## 2017-10-07 DIAGNOSIS — J3089 Other allergic rhinitis: Secondary | ICD-10-CM | POA: Diagnosis not present

## 2017-10-07 DIAGNOSIS — K824 Cholesterolosis of gallbladder: Secondary | ICD-10-CM | POA: Diagnosis not present

## 2017-10-07 DIAGNOSIS — J301 Allergic rhinitis due to pollen: Secondary | ICD-10-CM | POA: Diagnosis not present

## 2017-10-07 DIAGNOSIS — R1013 Epigastric pain: Secondary | ICD-10-CM | POA: Diagnosis not present

## 2017-10-31 DIAGNOSIS — J3089 Other allergic rhinitis: Secondary | ICD-10-CM | POA: Diagnosis not present

## 2017-10-31 DIAGNOSIS — J301 Allergic rhinitis due to pollen: Secondary | ICD-10-CM | POA: Diagnosis not present

## 2017-11-13 DIAGNOSIS — R1013 Epigastric pain: Secondary | ICD-10-CM | POA: Diagnosis not present

## 2017-11-20 DIAGNOSIS — J301 Allergic rhinitis due to pollen: Secondary | ICD-10-CM | POA: Diagnosis not present

## 2017-11-20 DIAGNOSIS — J3089 Other allergic rhinitis: Secondary | ICD-10-CM | POA: Diagnosis not present

## 2017-12-09 DIAGNOSIS — R1013 Epigastric pain: Secondary | ICD-10-CM | POA: Diagnosis not present

## 2017-12-11 DIAGNOSIS — J3089 Other allergic rhinitis: Secondary | ICD-10-CM | POA: Diagnosis not present

## 2017-12-11 DIAGNOSIS — J301 Allergic rhinitis due to pollen: Secondary | ICD-10-CM | POA: Diagnosis not present

## 2018-01-06 ENCOUNTER — Encounter: Payer: BLUE CROSS/BLUE SHIELD | Admitting: Gynecology

## 2018-01-06 ENCOUNTER — Ambulatory Visit (INDEPENDENT_AMBULATORY_CARE_PROVIDER_SITE_OTHER): Payer: BLUE CROSS/BLUE SHIELD | Admitting: Gynecology

## 2018-01-06 ENCOUNTER — Encounter: Payer: Self-pay | Admitting: Gynecology

## 2018-01-06 VITALS — BP 116/76 | Ht 64.0 in | Wt 184.0 lb

## 2018-01-06 DIAGNOSIS — Z79890 Hormone replacement therapy (postmenopausal): Secondary | ICD-10-CM

## 2018-01-06 DIAGNOSIS — Z01419 Encounter for gynecological examination (general) (routine) without abnormal findings: Secondary | ICD-10-CM

## 2018-01-06 DIAGNOSIS — N952 Postmenopausal atrophic vaginitis: Secondary | ICD-10-CM

## 2018-01-06 DIAGNOSIS — J3089 Other allergic rhinitis: Secondary | ICD-10-CM | POA: Diagnosis not present

## 2018-01-06 DIAGNOSIS — J301 Allergic rhinitis due to pollen: Secondary | ICD-10-CM | POA: Diagnosis not present

## 2018-01-06 MED ORDER — ESTRADIOL 1 MG PO TABS: 1.0000 mg | ORAL_TABLET | Freq: Every day | ORAL | 4 refills | Status: DC

## 2018-01-06 NOTE — Patient Instructions (Signed)
Follow-up in 1 year, sooner as needed. 

## 2018-01-06 NOTE — Progress Notes (Signed)
    Pamela ParisMonica H Pittman Jan 30, 1960 [id number]        57 y.o.  G0P0 for annual gynecologic exam.  Doing well without gynecologic complaints  Past medical history,surgical history, problem list, medications, allergies, family history and social history were all reviewed and documented as reviewed in the EPIC chart.  ROS:  Performed with pertinent positives and negatives included in the history, assessment and plan.   Additional significant findings : None   Exam: Kennon PortelaKim Gardner assistant Vitals:   01/06/18 1458  BP: 116/76  Weight: 184 lb (83.5 kg)  Height: 5\' 4"  (1.626 m)   Body mass index is 31.58 kg/m.  General appearance:  Normal affect, orientation and appearance. Skin: Grossly normal HEENT: Without gross lesions.  No cervical or supraclavicular adenopathy. Thyroid normal.  Lungs:  Clear without wheezing, rales or rhonchi Cardiac: RR, without RMG Abdominal:  Soft, nontender, without masses, guarding, rebound, organomegaly or hernia Breasts:  Examined lying and sitting without masses, retractions, discharge or axillary adenopathy. Pelvic:  Ext, BUS, Vagina: Normal with atrophic changes  Adnexa: Without masses or tenderness    Anus and perineum: Normal   Rectovaginal: Normal sphincter tone without palpated masses or tenderness.    Assessment/Plan:  57 y.o. G0P0 female for annual gynecologic exam.   1. Postmenopausal/HRT.  Using estradiol 1 mg daily.  Doing well.  We again reviewed the whole issue of HRT to include risks versus benefits as well as when to wean.  Increased risk of stroke heart attack DVT in the breast cancer issue was discussed.  Benefits as far as symptom relief and possible cardiovascular and bone health when started early was also discussed recognizing she does have osteopenia.  After lengthy discussion we both agree to decreasing her dose to 0.5 mg daily this year and see how she does.  If she tolerates this then to stay on it this year we will plan on weaning  next year.  If she does not tolerate the 0.5 mg from a symptom standpoint then she will go back to the 1 mg.  Refill x1 year provided. 2. Osteopenia.  DEXA 01/2017 T score -1.4.  Plan repeat DEXA in another year or 2. 3. Pap smear 12/2016.  No Pap smear done today.  No history of abnormal Pap smears.  Options to stop screening per current screening guidelines based on hysterectomy history reviewed.  Will readdress on an annual basis. 4. Colonoscopy 2014.  Repeat at their recommended interval. 5. Mammography 06/2017.  Continue with annual mammography when due.  Breast exam normal today. 6. Health maintenance.  No routine lab work done as patient reports this done elsewhere.  Follow-up 1 year, sooner as needed.   Dara Lordsimothy P Dalores Weger MD, 3:20 PM 01/06/2018

## 2018-01-28 DIAGNOSIS — J3089 Other allergic rhinitis: Secondary | ICD-10-CM | POA: Diagnosis not present

## 2018-01-28 DIAGNOSIS — J301 Allergic rhinitis due to pollen: Secondary | ICD-10-CM | POA: Diagnosis not present

## 2018-02-11 DIAGNOSIS — R1013 Epigastric pain: Secondary | ICD-10-CM | POA: Diagnosis not present

## 2018-02-18 DIAGNOSIS — J3089 Other allergic rhinitis: Secondary | ICD-10-CM | POA: Diagnosis not present

## 2018-02-18 DIAGNOSIS — J301 Allergic rhinitis due to pollen: Secondary | ICD-10-CM | POA: Diagnosis not present

## 2018-02-24 DIAGNOSIS — J209 Acute bronchitis, unspecified: Secondary | ICD-10-CM | POA: Diagnosis not present

## 2018-03-20 DIAGNOSIS — J301 Allergic rhinitis due to pollen: Secondary | ICD-10-CM | POA: Diagnosis not present

## 2018-03-20 DIAGNOSIS — J3089 Other allergic rhinitis: Secondary | ICD-10-CM | POA: Diagnosis not present

## 2018-03-27 DIAGNOSIS — M722 Plantar fascial fibromatosis: Secondary | ICD-10-CM | POA: Diagnosis not present

## 2018-04-15 DIAGNOSIS — J301 Allergic rhinitis due to pollen: Secondary | ICD-10-CM | POA: Diagnosis not present

## 2018-04-15 DIAGNOSIS — J3089 Other allergic rhinitis: Secondary | ICD-10-CM | POA: Diagnosis not present

## 2018-04-24 DIAGNOSIS — J301 Allergic rhinitis due to pollen: Secondary | ICD-10-CM | POA: Diagnosis not present

## 2018-04-24 DIAGNOSIS — J3089 Other allergic rhinitis: Secondary | ICD-10-CM | POA: Diagnosis not present

## 2018-07-02 ENCOUNTER — Other Ambulatory Visit: Payer: Self-pay | Admitting: Gynecology

## 2018-07-02 DIAGNOSIS — Z1231 Encounter for screening mammogram for malignant neoplasm of breast: Secondary | ICD-10-CM

## 2018-09-09 ENCOUNTER — Other Ambulatory Visit: Payer: Self-pay

## 2018-09-09 ENCOUNTER — Ambulatory Visit
Admission: RE | Admit: 2018-09-09 | Discharge: 2018-09-09 | Disposition: A | Discharge status: Home / Self Care | Payer: BC Managed Care – PPO | Source: Ambulatory Visit | Attending: Gynecology | Admitting: Gynecology

## 2018-09-09 DIAGNOSIS — Z1231 Encounter for screening mammogram for malignant neoplasm of breast: Secondary | ICD-10-CM

## 2018-10-07 ENCOUNTER — Encounter: Payer: Self-pay | Admitting: Gynecology

## 2019-01-15 ENCOUNTER — Encounter: Payer: BLUE CROSS/BLUE SHIELD | Admitting: Gynecology

## 2019-02-11 ENCOUNTER — Other Ambulatory Visit: Payer: Self-pay

## 2019-02-12 ENCOUNTER — Ambulatory Visit: Payer: BC Managed Care – PPO | Admitting: Obstetrics and Gynecology

## 2019-02-12 ENCOUNTER — Encounter: Payer: Self-pay | Admitting: Obstetrics and Gynecology

## 2019-02-12 VITALS — BP 124/70 | Ht 64.0 in | Wt 191.0 lb

## 2019-02-12 DIAGNOSIS — Z01419 Encounter for gynecological examination (general) (routine) without abnormal findings: Secondary | ICD-10-CM

## 2019-02-12 DIAGNOSIS — M8580 Other specified disorders of bone density and structure, unspecified site: Secondary | ICD-10-CM | POA: Diagnosis not present

## 2019-02-12 DIAGNOSIS — Z79890 Hormone replacement therapy (postmenopausal): Secondary | ICD-10-CM

## 2019-02-12 MED ORDER — ESTRADIOL 0.5 MG PO TABS: 0.5000 mg | ORAL_TABLET | ORAL | 3 refills | Status: DC

## 2019-02-12 MED ORDER — METRONIDAZOLE 500 MG PO TABS: 500.0000 mg | ORAL_TABLET | Freq: Two times a day (BID) | ORAL | 0 refills | Status: DC

## 2019-02-12 NOTE — Patient Instructions (Addendum)
Please remember to schedule your bone density scan I prescribed you Flagyl to address the vaginal discharge Side effects include metallic taste in the mouth, nausea, stomach upset, headache.  You must avoid consuming any alcoholic beverages while on the medication and at least 3 days after you have completed it.   Take with food and plenty of water.

## 2019-02-12 NOTE — Progress Notes (Signed)
Pamela Pittman [birth year] [id number]  SUBJECTIVE:  59 y.o. G0P0 female for annual routine gynecologic exam. She has been having slight increase in vaginal discharge with noting occasional odor and some vaginal itching.  She did try Monistat cream over-the-counter and the symptoms temporarily sort of resolved but have returned.  Current Outpatient Medications  Medication Sig Dispense Refill  . ALPRAZolam (XANAX) 0.25 MG tablet Take 1 tablet (0.25 mg total) by mouth at bedtime as needed. 30 tablet 4  . Calcium Carbonate-Vitamin D (CALCIUM + D PO) Take by mouth 2 (two) times daily.      . cetirizine (ZYRTEC) 10 MG tablet Take 10 mg by mouth daily.    Marland Kitchen estradiol (ESTRACE) 0.5 MG tablet Take 1 tablet (0.5 mg total) by mouth every day. 90 tablet 3  . Fluticasone Propionate (FLONASE NA) Place into the nose.    . hydrochlorothiazide (MICROZIDE) 12.5 MG capsule Take 12.5 mg by mouth daily.    . Montelukast Sodium (SINGULAIR PO) Take by mouth.    . SUMAtriptan (IMITREX) 20 MG/ACT nasal spray Place 1 spray (20 mg total) into the nose every 2 (two) hours as needed for migraine or headache. May repeat in 2 hours if headache persists or recurs. 1 Inhaler 2  . TOPIRAMATE PO Take by mouth.     No current facility-administered medications for this visit.   Allergies: Ciprofloxacin, Codeine, and Demerol  No LMP recorded. Patient has had a hysterectomy.  Past medical history,surgical history, problem list, medications, allergies, family history and social history were all reviewed and documented as reviewed in the EPIC chart.  ROS:  Feeling well. No dyspnea or chest pain on exertion.  No abdominal pain, change in bowel habits, black or bloody stools.  No urinary tract symptoms. GYN ROS: no abnormal bleeding, no pelvic pain, + vaginal discharge with itching, no breast pain or new or enlarging lumps on self exam. No neurological complaints.   OBJECTIVE:  BP 124/70   Ht 5\' 4"  (1.626 m)   Wt 191 lb  (86.6 kg)   BMI 32.79 kg/m  The patient appears well, alert, oriented x 3, in no distress. ENT normal.  Neck supple. No cervical or supraclavicular adenopathy or thyromegaly.  Lungs are clear, good air entry, no wheezes, rhonchi or rales. S1 and S2 normal, no murmurs, regular rate and rhythm.  Abdomen soft without tenderness, guarding, mass or organomegaly.  Neurological is normal, no focal findings.  BREAST EXAM: breasts appear normal, no suspicious masses, no skin or nipple changes or axillary nodes  PELVIC EXAM: VULVA: normal appearing vulva with no masses, tenderness or lesions, VAGINA: normal appearing vagina with normal color, no lesions, yellow whitish discharge present, no odor, CERVIX: normal appearing cervix without discharge or lesions, UTERUS: uterus is normal size, shape, consistency and nontender, ADNEXA: normal adnexa in size, nontender and no masses  Chaperone: Caryn Bee present during the examination  ASSESSMENT:  59 y.o. G0P0 here for annual gynecologic exam  PLAN:   1. Postmenopausal/HRT.  Prior hysterectomy retained ovaries.  Currently doing well on estradiol 0.5 mg daily with minimal symptoms.  We discussed further weaning down to estradiol 0.5 mg every other day and seeing how that goes.  She is provided a refill let us know if the new dosing is not working for her.  Risks of HRT including thrombosis and breast cancer risks are reviewed. 2. Pap smear 12/2016. Not repeated today. Discussed option to discontinue screening based on prior hysterectomy and no significant history  of abnormal Pap smears.  If she should decide that she would like to continue, she will be due for a Pap smear at the next annual exam. 3.  Vaginal discharge.  She has already self treated for presumed yeast and this does not work, I will empirically treat her with Flagyl 500 mg twice daily for 14 days and we discussed possible side effects, avoidance of alcohol. 4. Mammogram 09/2018. Will continue  with annual mammography. Breast exam normal today. 5. Colonoscopy 2014. Recommended that she continue per the prescribed interval.  6. Osteopenia.  Last DEXA 01/2017.  Recommend repeating DEXA this year. 7. Health maintenance.  No lab work as she has this completed with her primary care provider.    Return annually or sooner, prn.  Theresia Majors MD  02/12/19

## 2019-02-13 NOTE — Addendum Note (Signed)
Addended by: Dayna Barker on: 02/13/2019 11:07 AM   Modules accepted: Orders

## 2019-02-18 ENCOUNTER — Other Ambulatory Visit: Payer: Self-pay | Admitting: *Deleted

## 2019-02-18 DIAGNOSIS — M8589 Other specified disorders of bone density and structure, multiple sites: Secondary | ICD-10-CM

## 2019-02-24 IMAGING — MG DIGITAL SCREENING BILATERAL MAMMOGRAM WITH TOMO AND CAD
3 series · 3 of 3 positions shown · non-contrast
Comparison: Previous exam(s).

CLINICAL DATA: Screening.

EXAM:
DIGITAL SCREENING BILATERAL MAMMOGRAM WITH TOMO AND CAD

[L CC synth-2D]
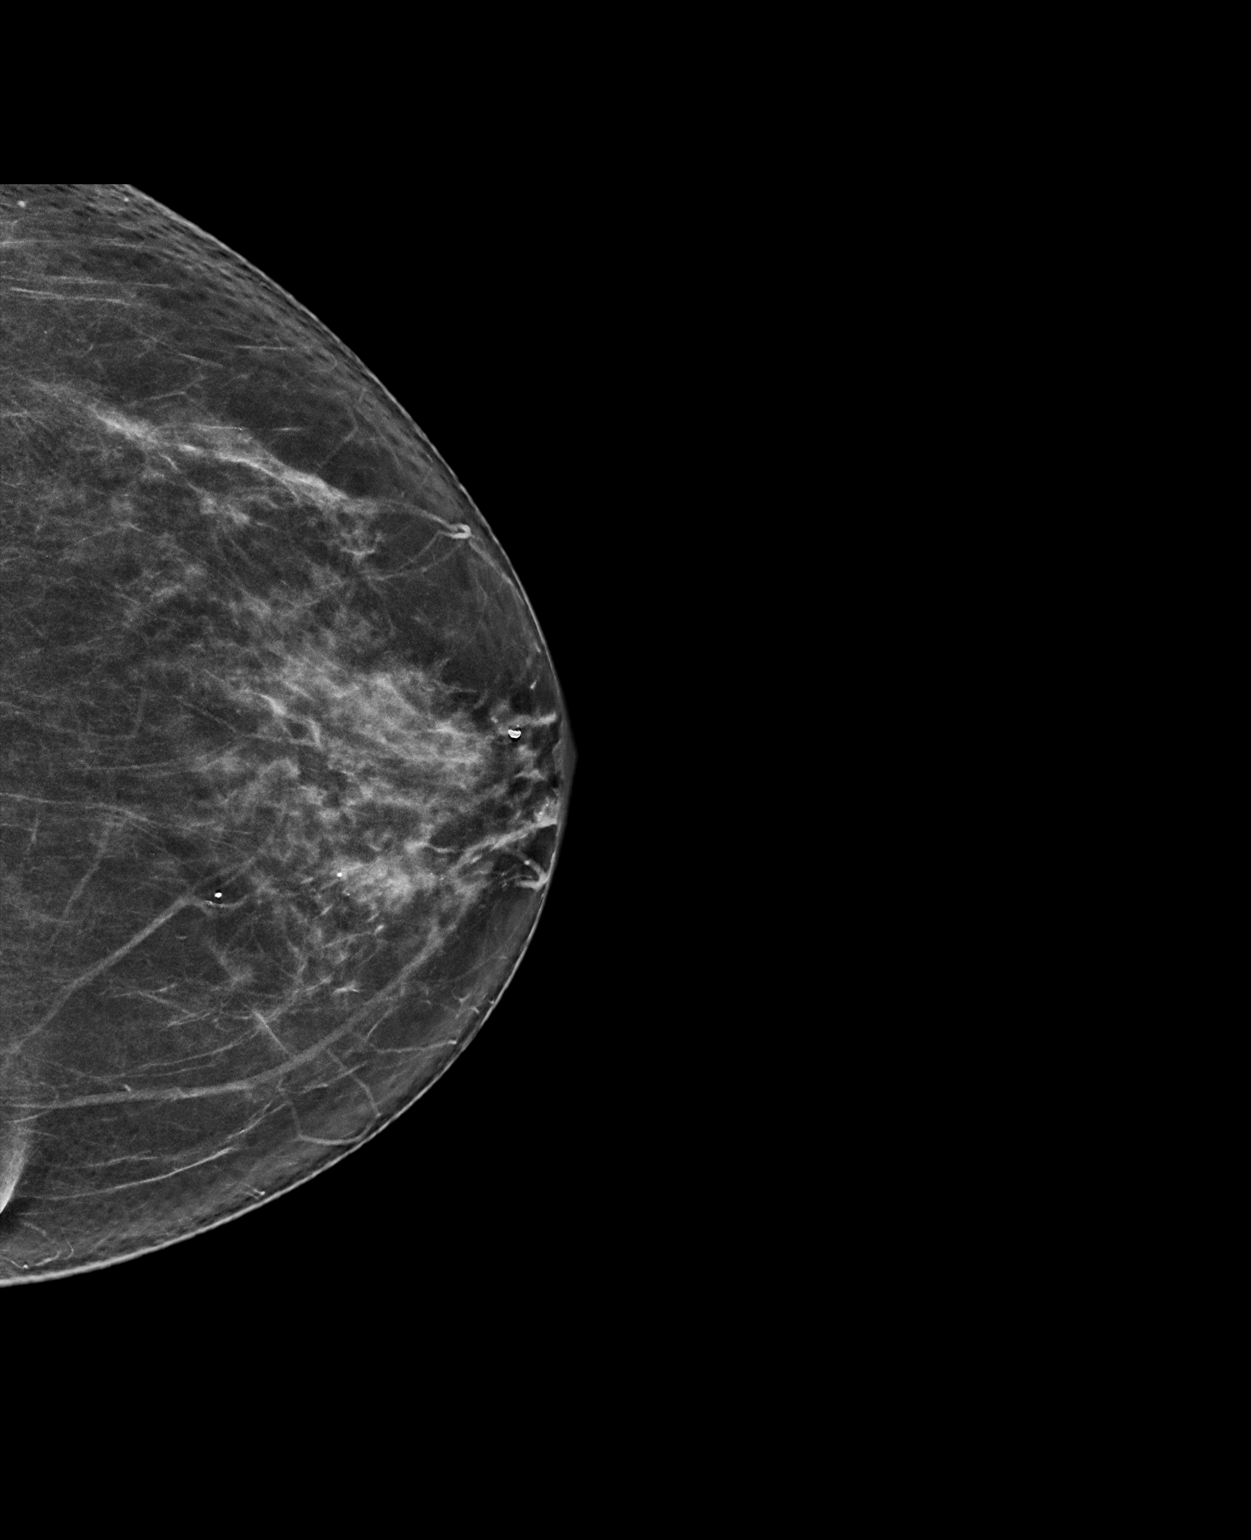

[R MLO synth-2D]
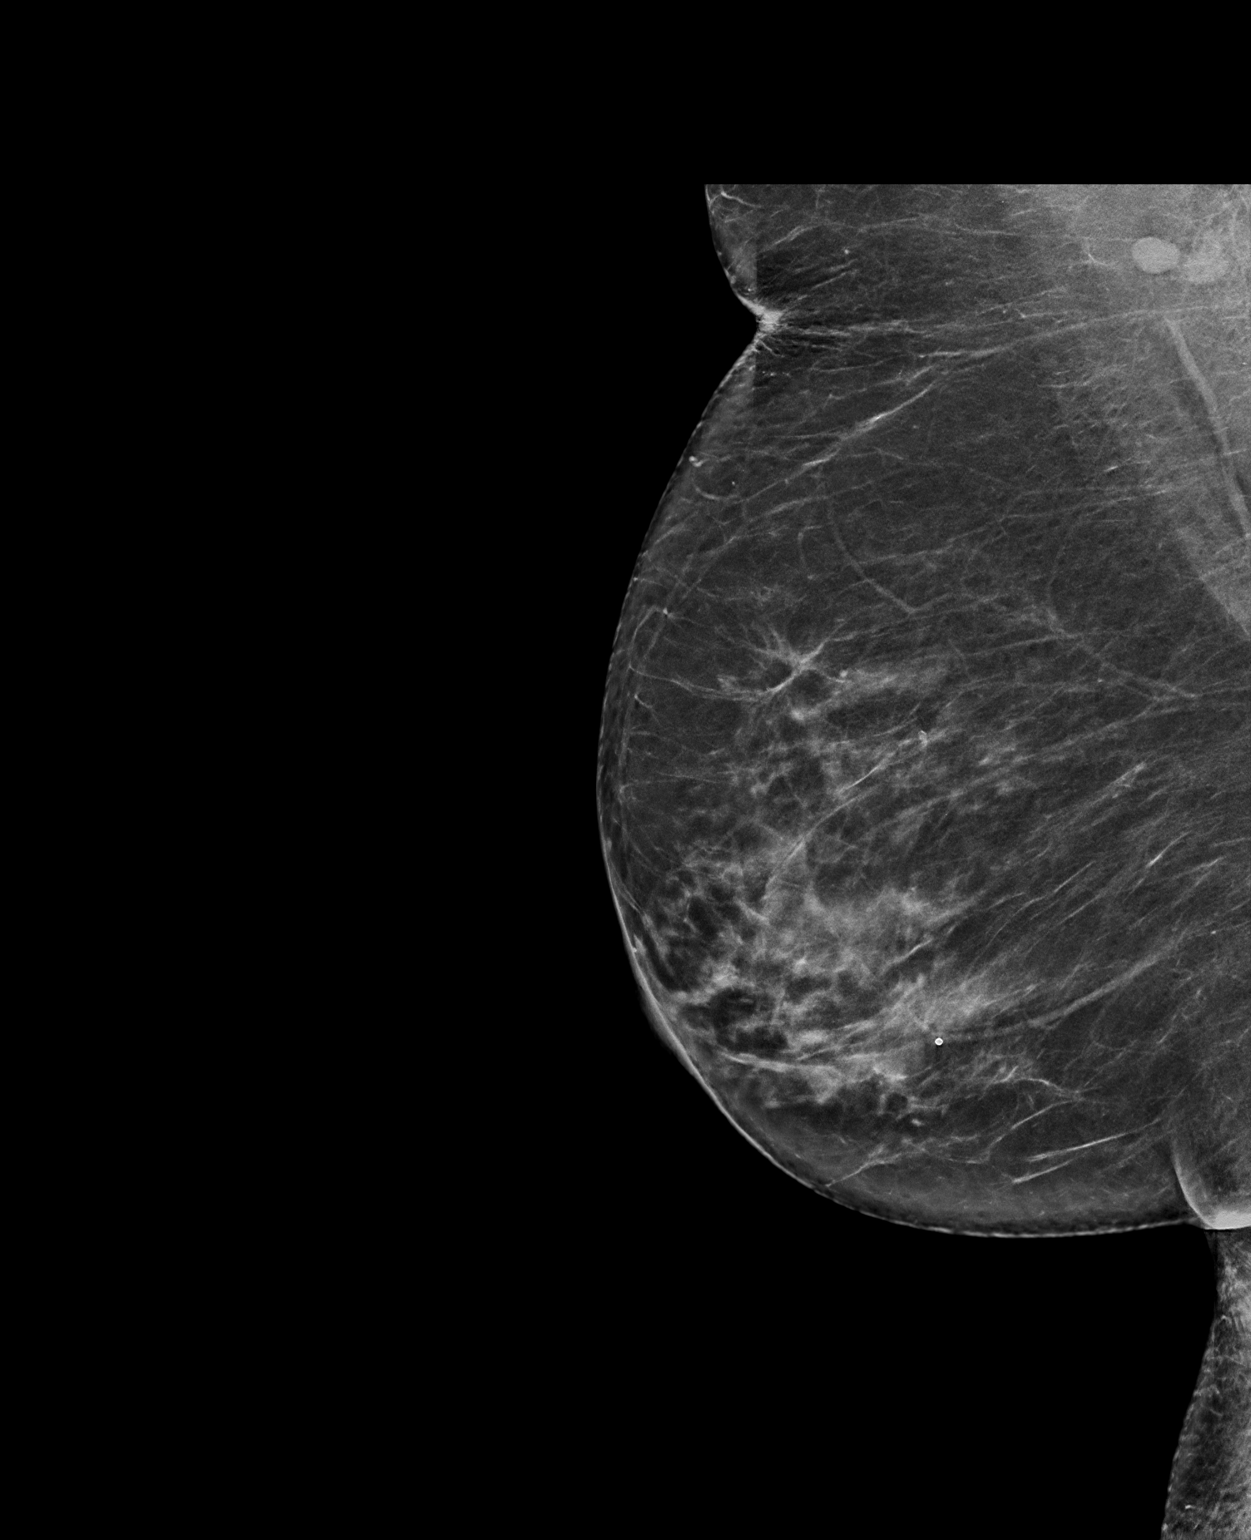

[L MLO synth-2D]
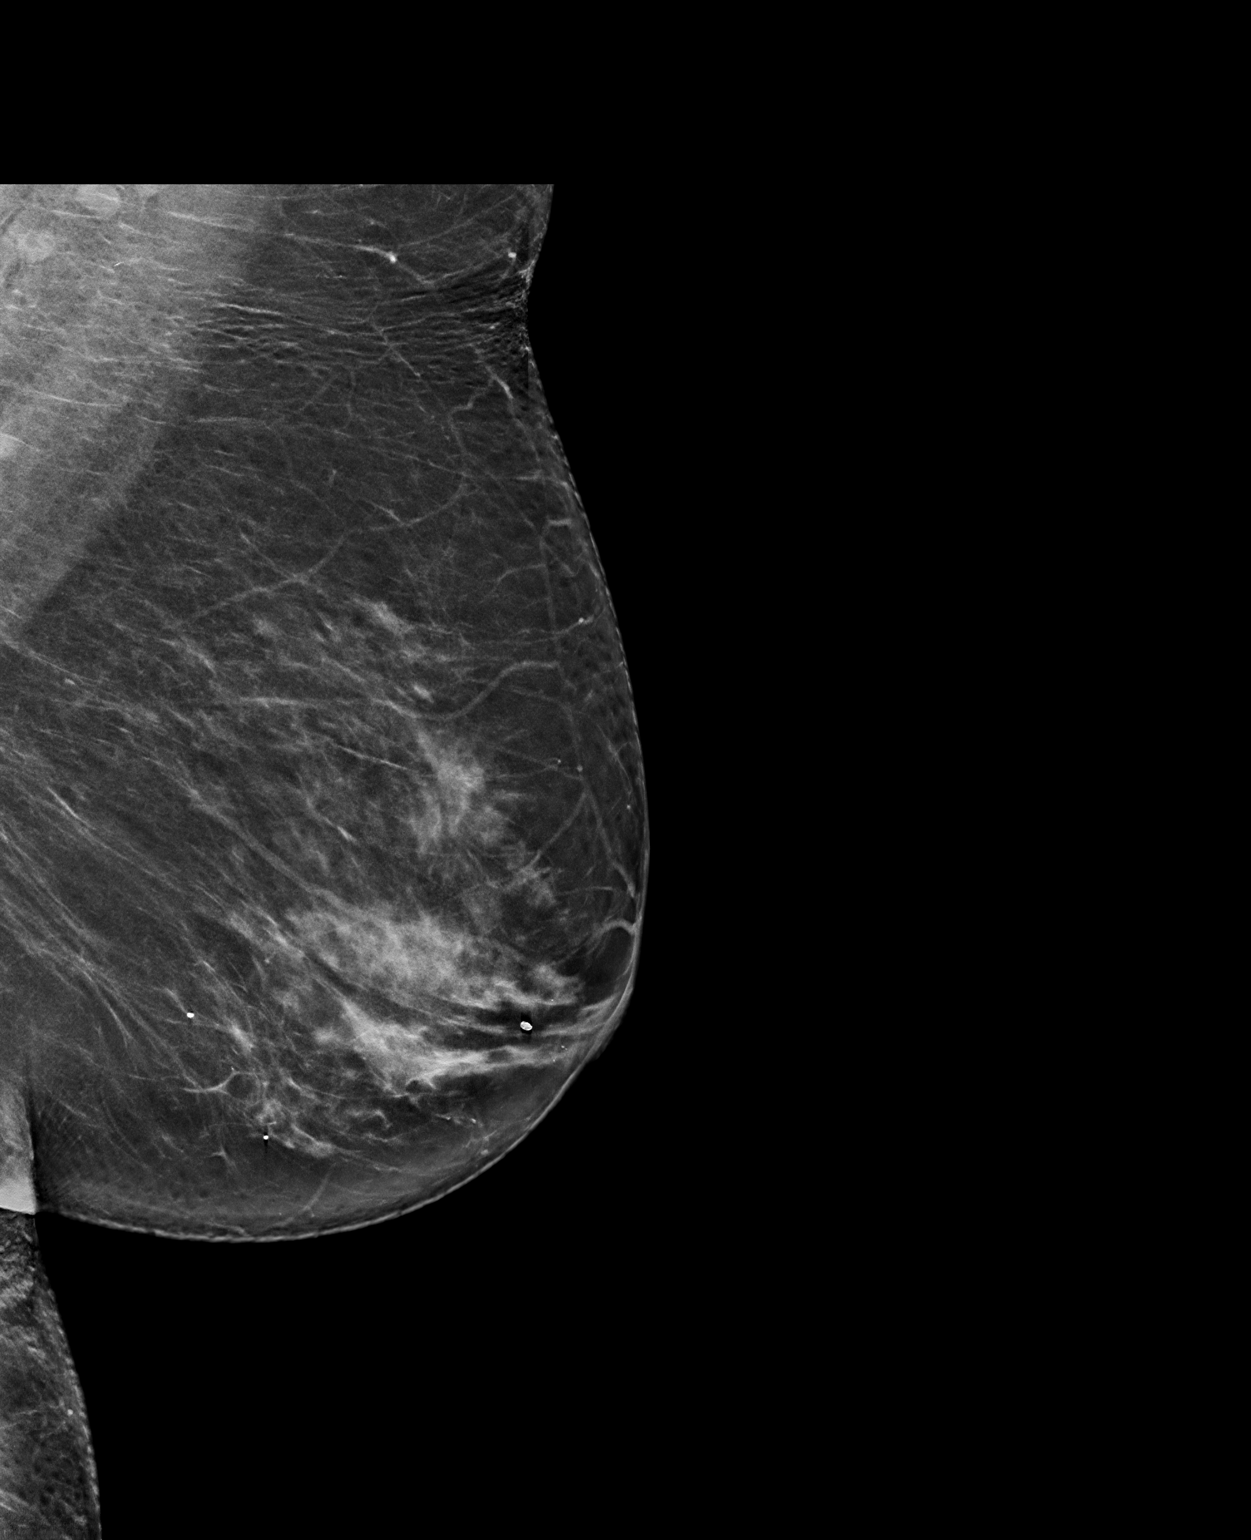

[3 of 3 positions shown; findings below may reference images not displayed]

ACR Breast Density Category c: The breast tissue is heterogeneously
dense, which may obscure small masses.
FINDINGS: There are no findings suspicious for malignancy. Images were
processed with CAD.
IMPRESSION: No mammographic evidence of malignancy. A result letter of this
screening mammogram will be mailed directly to the patient.

RECOMMENDATION:
Screening mammogram in one year. (Code:FT-U-LHB)

BI-RADS CATEGORY  1: Negative.

## 2019-03-25 ENCOUNTER — Other Ambulatory Visit: Payer: Self-pay

## 2019-03-26 ENCOUNTER — Other Ambulatory Visit: Payer: Self-pay | Admitting: Obstetrics and Gynecology

## 2019-03-26 ENCOUNTER — Other Ambulatory Visit: Payer: Self-pay | Admitting: *Deleted

## 2019-03-26 ENCOUNTER — Other Ambulatory Visit: Payer: Self-pay | Admitting: Obstetrics & Gynecology

## 2019-03-26 ENCOUNTER — Ambulatory Visit (INDEPENDENT_AMBULATORY_CARE_PROVIDER_SITE_OTHER): Payer: BC Managed Care – PPO

## 2019-03-26 DIAGNOSIS — Z780 Asymptomatic menopausal state: Secondary | ICD-10-CM

## 2019-03-26 DIAGNOSIS — M8580 Other specified disorders of bone density and structure, unspecified site: Secondary | ICD-10-CM

## 2019-03-26 DIAGNOSIS — M8589 Other specified disorders of bone density and structure, multiple sites: Secondary | ICD-10-CM

## 2019-04-06 ENCOUNTER — Encounter: Payer: Self-pay | Admitting: Obstetrics and Gynecology

## 2019-07-27 ENCOUNTER — Other Ambulatory Visit: Payer: Self-pay | Admitting: Obstetrics and Gynecology

## 2019-07-27 DIAGNOSIS — Z1231 Encounter for screening mammogram for malignant neoplasm of breast: Secondary | ICD-10-CM

## 2019-09-10 ENCOUNTER — Other Ambulatory Visit: Payer: Self-pay

## 2019-09-10 ENCOUNTER — Ambulatory Visit
Admission: RE | Admit: 2019-09-10 | Discharge: 2019-09-10 | Disposition: A | Discharge status: Home / Self Care | Payer: BC Managed Care – PPO | Source: Ambulatory Visit | Attending: Obstetrics and Gynecology | Admitting: Obstetrics and Gynecology

## 2019-09-10 DIAGNOSIS — Z1231 Encounter for screening mammogram for malignant neoplasm of breast: Secondary | ICD-10-CM

## 2020-02-15 ENCOUNTER — Encounter: Payer: BC Managed Care – PPO | Admitting: Obstetrics and Gynecology

## 2020-02-18 ENCOUNTER — Encounter: Payer: Self-pay | Admitting: Obstetrics and Gynecology

## 2020-02-18 ENCOUNTER — Ambulatory Visit (INDEPENDENT_AMBULATORY_CARE_PROVIDER_SITE_OTHER): Payer: BC Managed Care – PPO | Admitting: Obstetrics and Gynecology

## 2020-02-18 ENCOUNTER — Other Ambulatory Visit: Payer: Self-pay

## 2020-02-18 VITALS — BP 126/78 | HR 80 | Ht 63.75 in | Wt 171.0 lb

## 2020-02-18 DIAGNOSIS — Z79890 Hormone replacement therapy (postmenopausal): Secondary | ICD-10-CM

## 2020-02-18 DIAGNOSIS — M8580 Other specified disorders of bone density and structure, unspecified site: Secondary | ICD-10-CM

## 2020-02-18 DIAGNOSIS — Z01419 Encounter for gynecological examination (general) (routine) without abnormal findings: Secondary | ICD-10-CM | POA: Diagnosis not present

## 2020-02-18 DIAGNOSIS — R011 Cardiac murmur, unspecified: Secondary | ICD-10-CM | POA: Diagnosis not present

## 2020-02-18 MED ORDER — ESTRADIOL 0.5 MG PO TABS: 0.5000 mg | ORAL_TABLET | ORAL | 3 refills | Status: AC

## 2020-02-18 NOTE — Progress Notes (Signed)
CATE ORAVEC [birth year] [id number]  SUBJECTIVE:  60 y.o. G0P0 female for annual routine gynecologic exam. No gynecologic concerns.  Current Outpatient Medications  Medication Sig Dispense Refill  . ALPRAZolam (XANAX) 0.25 MG tablet Take 1 tablet (0.25 mg total) by mouth at bedtime as needed. 30 tablet 4  . Calcium Carbonate-Vitamin D (CALCIUM + D PO) Take by mouth 2 (two) times daily.      . cetirizine (ZYRTEC) 10 MG tablet Take 10 mg by mouth daily.    Marland Kitchen estradiol (ESTRACE) 0.5 MG tablet Take 1 tablet (0.5 mg total) by mouth every day. 90 tablet 3  . Fluticasone Propionate (FLONASE NA) Place into the nose.    . hydrochlorothiazide (MICROZIDE) 12.5 MG capsule Take 12.5 mg by mouth daily.    . Montelukast Sodium (SINGULAIR PO) Take by mouth.    . SUMAtriptan (IMITREX) 20 MG/ACT nasal spray Place 1 spray (20 mg total) into the nose every 2 (two) hours as needed for migraine or headache. May repeat in 2 hours if headache persists or recurs. 1 Inhaler 2  . TOPIRAMATE PO Take by mouth.     No current facility-administered medications for this visit.   Allergies: Ciprofloxacin, Codeine, and Demerol  No LMP recorded. Patient has had a hysterectomy.  Past medical history,surgical history, problem list, medications, allergies, family history and social history were all reviewed and documented as reviewed in the EPIC chart.  ROS: Pertinent positives and negatives as reviewed in HPI   OBJECTIVE:  BP 126/78 (BP Location: Right Arm, Patient Position: Sitting, Cuff Size: Normal)   Pulse 80   Ht 5' 3.75" (1.619 m)   Wt 171 lb (77.6 kg)   BMI 29.58 kg/m  The patient appears well, alert, oriented x 3, in no distress. ENT normal.  Neck supple. No cervical or supraclavicular adenopathy or thyromegaly.  Lungs are clear, good air entry, no wheezes, rhonchi or rales. S1 and S2 normal, 2/6 systolic murmur at RUSB, regular rate and rhythm.  Abdomen soft without tenderness, guarding, mass or  organomegaly.  Neurological is normal, no focal findings.  BREAST EXAM: breasts appear normal, no suspicious masses, no skin or nipple changes or axillary nodes   PELVIC EXAM: VULVA: normal appearing vulva with atrophic change, no masses, tenderness or lesions, VAGINA: normal appearing vagina with atrophic change, normal color, no lesions, CERVIX: normal appearing cervix without discharge or lesions, UTERUS: uterus is normal size, shape, consistency and nontender, ADNEXA: normal adnexa in size, nontender and no masses  Chaperone: Zenovia Jordan present during the examination  ASSESSMENT:  60 y.o. G0P0 here for annual gynecologic exam  PLAN:   1. Postmenopausal/HRT.  Prior hysterectomy retained ovaries.  Currently doing well on estradiol 0.5 mg daily with minimal symptoms.  We discussed further weaning down to estradiol 0.5 mg every other day and seeing how that goes.  She is provided a refill and will try weaning as discussed.  Risks of HRT including thrombosis and breast cancer risks are reviewed. 2. Pap smear 12/2016. Not repeated today. Discussed option to discontinue screening based on prior hysterectomy and no significant history of abnormal Pap smears and she is comfortable with not screening at this point. 3. Mammogram 09/2019. Will continue with annual mammography. Breast exam normal today. 4. Colonoscopy 2014. Recommended that she continue per the prescribed interval.  6. Osteopenia.  Last DEXA 03/2019. Stable, T-score -1.2.  Recommend repeating DEXA at 2 year interval.  To annual calcium and vitamin D and weightbearing exercise. 7. Health  maintenance.  No lab work as she has this completed elsewhere.  Soft murmur heard during cardiac auscultation today.  The patient says her primary has detected this intermittently in the past.  I recommended that she make an appointment with her primary to look into potential further work-up.  The patient is aware that I will only be at this practice  until early March 2022 so she knows to make sure she requests follow-up on results for any medical tests that she does when I am no longer at the practice.   Return annually or sooner, prn.  Theresia Majors MD  02/18/20

## 2020-08-08 ENCOUNTER — Other Ambulatory Visit: Payer: Self-pay | Admitting: Nurse Practitioner

## 2020-08-08 DIAGNOSIS — Z1231 Encounter for screening mammogram for malignant neoplasm of breast: Secondary | ICD-10-CM

## 2020-09-27 ENCOUNTER — Other Ambulatory Visit: Payer: Self-pay

## 2020-09-27 ENCOUNTER — Ambulatory Visit
Admission: RE | Admit: 2020-09-27 | Discharge: 2020-09-27 | Disposition: A | Discharge status: Home / Self Care | Payer: BC Managed Care – PPO | Source: Ambulatory Visit | Attending: Nurse Practitioner | Admitting: Nurse Practitioner

## 2020-09-27 DIAGNOSIS — Z1231 Encounter for screening mammogram for malignant neoplasm of breast: Secondary | ICD-10-CM

## 2021-02-09 ENCOUNTER — Other Ambulatory Visit: Payer: Self-pay

## 2021-02-09 ENCOUNTER — Ambulatory Visit (INDEPENDENT_AMBULATORY_CARE_PROVIDER_SITE_OTHER): Payer: BC Managed Care – PPO | Admitting: Nurse Practitioner

## 2021-02-09 ENCOUNTER — Encounter: Payer: Self-pay | Admitting: Nurse Practitioner

## 2021-02-09 VITALS — BP 134/86 | Ht 63.5 in | Wt 182.0 lb

## 2021-02-09 DIAGNOSIS — M8589 Other specified disorders of bone density and structure, multiple sites: Secondary | ICD-10-CM

## 2021-02-09 DIAGNOSIS — Z01419 Encounter for gynecological examination (general) (routine) without abnormal findings: Secondary | ICD-10-CM | POA: Diagnosis not present

## 2021-02-09 DIAGNOSIS — Z79890 Hormone replacement therapy (postmenopausal): Secondary | ICD-10-CM

## 2021-02-09 DIAGNOSIS — Z780 Asymptomatic menopausal state: Secondary | ICD-10-CM | POA: Diagnosis not present

## 2021-02-09 NOTE — Progress Notes (Signed)
Pamela Pittman June 19, 1960 [id number]   History:  61 y.o. G0 presents for annual exam. Postmenopausal - on ERT. She is taking estradiol 0.25 mg (half of 0.5 mg tablet) every other day with no return of menopausal symptoms. S/P 2006 TVH for DUB s/t fibroids. Normal pap history. HTN, migraines without aura managed by PCP. Heart murmur, normal Echo 02/2020. Moving to [place] in the next couple of months. Her mother is moving there too, will reside in [place]. Husband diagnosed with prostate cancer last year, doing well.   Gynecologic History No LMP recorded. Patient has had a hysterectomy.   Contraception/Family planning: status post hysterectomy Sexually active: Yes  Health Maintenance Last Pap: 12/27/2016. Results were: Normal Last mammogram: 09/27/2020. Results were: Normal Last colonoscopy: 02/01/2012. Results were: Normal, 10-year recall Last Dexa: 03/26/2019. Results were: T-score -1.2, FRAX 12% / 0.8%  Past medical history, past surgical history, family history and social history were all reviewed and documented in the EPIC chart. Married. Aesthetician.  ROS:  A ROS was performed and pertinent positives and negatives are included.  Exam:  Vitals:   02/09/21 1113  BP: 134/86  Weight: 182 lb (82.6 kg)  Height: 5' 3.5" (1.613 m)   Body mass index is 31.73 kg/m.  General appearance:  Normal Thyroid:  Symmetrical, normal in size, without palpable masses or nodularity. Respiratory  Auscultation:  Clear without wheezing or rhonchi Cardiovascular  Auscultation:  Regular rate, without rubs, murmurs or gallops  Edema/varicosities:  Not grossly evident Abdominal  Soft,nontender, without masses, guarding or rebound.  Liver/spleen:  No organomegaly noted  Hernia:  None appreciated  Skin  Inspection:  Grossly normal Breasts: Examined lying and sitting.   Right: Without masses, retractions, nipple discharge or axillary adenopathy.   Left: Without masses, retractions, nipple discharge  or axillary adenopathy. Genitourinary   Inguinal/mons:  Normal without inguinal adenopathy  External genitalia:  Normal appearing vulva with no masses, tenderness, or lesions  BUS/Urethra/Skene's glands:  Normal  Vagina:  Normal appearing with normal color and discharge, no lesions. Atrophic changes  Cervix:  Absent  Uterus:  Absent  Adnexa/parametria:     Rt: Normal in size, without masses or tenderness.   Lt: Normal in size, without masses or tenderness.  Anus and perineum: Normal  Digital rectal exam: Normal sphincter tone without palpated masses or tenderness  Patient informed chaperone available to be present for breast and pelvic exam. Patient has requested no chaperone to be present. Patient has been advised what will be completed during breast and pelvic exam.   Assessment/Plan:  61 y.o. G0 for annual exam.   Well female exam with routine gynecological exam - Education provided on SBEs, importance of preventative screenings, current guidelines, high calcium diet, regular exercise, and multivitamin daily. Labs with PCP.   Postmenopausal hormone therapy - She is taking estradiol 0.25 mg (half of 0.5 mg tablet) every other day with no return of menopausal symptoms. Recommend stopping as she is on very low dose with no symptoms. She is agreeable. If bothersome symptoms return she will reach out.   Osteopenia of multiple sites - Plan: DG Bone Density. T-score -1.2 without elevated FRAX. Continue Vitamin D + Calcium and regular exercise. Will repeat DXA in March.   Postmenopausal - Plan: DG Bone Density. On ERT currently. S/P 2006 TVH for DUB s/t fibroids  Screening for cervical cancer - Normal Pap history. No longer screening per guidelines.   Screening for breast cancer - Normal mammogram history.  Continue annual screenings.  Normal breast exam today.  Screening for colon cancer - 2014 colonoscopy. Will repeat at 10-year interval per GI's recommendation.   Return in 1 year for  annual.     Tamela Gammon DNP, 11:29 AM 02/09/2021

## 2021-03-16 ENCOUNTER — Other Ambulatory Visit: Payer: Self-pay
